# Patient Record
Sex: Female | Born: 1945 | Race: Black or African American | Hispanic: No | Smoking: Never smoker
Health system: Southern US, Community
[De-identification: ages and names within clinical notes are randomized; demographics above are authoritative.]

## PROBLEM LIST (undated history)

## (undated) DIAGNOSIS — M1990 Unspecified osteoarthritis, unspecified site: Secondary | ICD-10-CM

## (undated) DIAGNOSIS — E039 Hypothyroidism, unspecified: Secondary | ICD-10-CM

## (undated) DIAGNOSIS — I10 Essential (primary) hypertension: Secondary | ICD-10-CM

## (undated) HISTORY — PX: TOE AMPUTATION: SHX809

## (undated) HISTORY — PX: ABDOMINAL HYSTERECTOMY: SHX81

## (undated) HISTORY — PX: FRACTURE SURGERY: SHX138

## (undated) HISTORY — PX: CHOLECYSTECTOMY: SHX55

## (undated) HISTORY — PX: CLAVICLE SURGERY: SHX598

## (undated) HISTORY — PX: ANKLE FRACTURE SURGERY: SHX122

---

## 1998-04-26 ENCOUNTER — Ambulatory Visit (HOSPITAL_COMMUNITY): Admission: RE | Admit: 1998-04-26 | Discharge: 1998-04-26 | Payer: Self-pay | Admitting: General Surgery

## 1998-05-03 ENCOUNTER — Ambulatory Visit (HOSPITAL_COMMUNITY): Admission: RE | Admit: 1998-05-03 | Discharge: 1998-05-03 | Payer: Self-pay | Admitting: General Surgery

## 1998-05-11 ENCOUNTER — Encounter: Admission: RE | Admit: 1998-05-11 | Discharge: 1998-05-11 | Payer: Self-pay | Admitting: Infectious Diseases

## 1998-05-14 ENCOUNTER — Encounter (HOSPITAL_COMMUNITY): Admission: RE | Admit: 1998-05-14 | Discharge: 1998-08-12 | Payer: Self-pay | Admitting: Infectious Diseases

## 1998-05-24 ENCOUNTER — Encounter: Admission: RE | Admit: 1998-05-24 | Discharge: 1998-05-24 | Payer: Self-pay | Admitting: Infectious Diseases

## 1998-06-29 ENCOUNTER — Encounter: Admission: RE | Admit: 1998-06-29 | Discharge: 1998-06-29 | Payer: Self-pay | Admitting: Internal Medicine

## 2000-02-26 ENCOUNTER — Encounter: Payer: Self-pay | Admitting: Cardiovascular Disease

## 2000-02-26 ENCOUNTER — Ambulatory Visit (HOSPITAL_COMMUNITY): Admission: RE | Admit: 2000-02-26 | Discharge: 2000-02-26 | Payer: Self-pay | Admitting: Cardiovascular Disease

## 2000-06-03 ENCOUNTER — Encounter: Admission: RE | Admit: 2000-06-03 | Discharge: 2000-06-03 | Payer: Self-pay | Admitting: Cardiovascular Disease

## 2000-06-03 ENCOUNTER — Encounter: Payer: Self-pay | Admitting: Cardiovascular Disease

## 2000-06-07 ENCOUNTER — Encounter: Payer: Self-pay | Admitting: Emergency Medicine

## 2000-06-07 ENCOUNTER — Emergency Department (HOSPITAL_COMMUNITY): Admission: EM | Admit: 2000-06-07 | Discharge: 2000-06-07 | Payer: Self-pay | Admitting: Emergency Medicine

## 2000-06-09 ENCOUNTER — Inpatient Hospital Stay (HOSPITAL_COMMUNITY): Admission: AD | Admit: 2000-06-09 | Discharge: 2000-06-24 | Payer: Self-pay | Admitting: Cardiovascular Disease

## 2000-06-09 ENCOUNTER — Encounter: Payer: Self-pay | Admitting: Cardiovascular Disease

## 2000-06-13 ENCOUNTER — Encounter (HOSPITAL_BASED_OUTPATIENT_CLINIC_OR_DEPARTMENT_OTHER): Payer: Self-pay | Admitting: General Surgery

## 2000-06-16 ENCOUNTER — Encounter: Payer: Self-pay | Admitting: Cardiovascular Disease

## 2000-06-25 ENCOUNTER — Encounter (HOSPITAL_COMMUNITY): Admission: RE | Admit: 2000-06-25 | Discharge: 2000-09-23 | Payer: Self-pay | Admitting: Cardiovascular Disease

## 2000-07-08 ENCOUNTER — Encounter (HOSPITAL_BASED_OUTPATIENT_CLINIC_OR_DEPARTMENT_OTHER): Payer: Self-pay | Admitting: General Surgery

## 2000-07-08 ENCOUNTER — Ambulatory Visit (HOSPITAL_COMMUNITY): Admission: RE | Admit: 2000-07-08 | Discharge: 2000-07-08 | Payer: Self-pay | Admitting: General Surgery

## 2000-08-04 ENCOUNTER — Encounter: Admission: RE | Admit: 2000-08-04 | Discharge: 2000-11-02 | Payer: Self-pay | Admitting: Orthopedic Surgery

## 2000-08-06 ENCOUNTER — Ambulatory Visit (HOSPITAL_COMMUNITY): Admission: RE | Admit: 2000-08-06 | Discharge: 2000-08-06 | Payer: Self-pay | Admitting: General Surgery

## 2000-08-06 ENCOUNTER — Encounter (HOSPITAL_BASED_OUTPATIENT_CLINIC_OR_DEPARTMENT_OTHER): Payer: Self-pay | Admitting: General Surgery

## 2001-01-12 ENCOUNTER — Encounter: Admission: RE | Admit: 2001-01-12 | Discharge: 2001-04-12 | Payer: Self-pay | Admitting: Cardiovascular Disease

## 2001-05-31 ENCOUNTER — Emergency Department (HOSPITAL_COMMUNITY): Admission: EM | Admit: 2001-05-31 | Discharge: 2001-05-31 | Payer: Self-pay | Admitting: *Deleted

## 2001-06-01 ENCOUNTER — Encounter: Payer: Self-pay | Admitting: Cardiovascular Disease

## 2001-06-01 ENCOUNTER — Inpatient Hospital Stay (HOSPITAL_COMMUNITY): Admission: AD | Admit: 2001-06-01 | Discharge: 2001-06-05 | Payer: Self-pay | Admitting: Cardiovascular Disease

## 2001-06-15 ENCOUNTER — Encounter: Admission: RE | Admit: 2001-06-15 | Discharge: 2001-08-02 | Payer: Self-pay | Admitting: Orthopedic Surgery

## 2001-10-23 ENCOUNTER — Encounter: Admission: RE | Admit: 2001-10-23 | Discharge: 2002-01-21 | Payer: Self-pay | Admitting: Surgery

## 2002-02-26 ENCOUNTER — Emergency Department (HOSPITAL_COMMUNITY): Admission: EM | Admit: 2002-02-26 | Discharge: 2002-02-26 | Payer: Self-pay | Admitting: Emergency Medicine

## 2002-03-02 ENCOUNTER — Encounter: Admission: RE | Admit: 2002-03-02 | Discharge: 2002-03-25 | Payer: Self-pay | Admitting: *Deleted

## 2002-03-17 ENCOUNTER — Encounter: Payer: Self-pay | Admitting: *Deleted

## 2002-03-17 ENCOUNTER — Emergency Department (HOSPITAL_COMMUNITY): Admission: EM | Admit: 2002-03-17 | Discharge: 2002-03-17 | Payer: Self-pay | Admitting: *Deleted

## 2002-03-30 ENCOUNTER — Encounter (HOSPITAL_BASED_OUTPATIENT_CLINIC_OR_DEPARTMENT_OTHER): Admission: RE | Admit: 2002-03-30 | Discharge: 2002-06-03 | Payer: Self-pay | Admitting: Internal Medicine

## 2002-06-15 ENCOUNTER — Encounter (HOSPITAL_BASED_OUTPATIENT_CLINIC_OR_DEPARTMENT_OTHER): Admission: RE | Admit: 2002-06-15 | Discharge: 2002-08-12 | Payer: Self-pay | Admitting: Internal Medicine

## 2002-09-14 ENCOUNTER — Encounter (HOSPITAL_BASED_OUTPATIENT_CLINIC_OR_DEPARTMENT_OTHER): Admission: RE | Admit: 2002-09-14 | Discharge: 2002-12-13 | Payer: Self-pay | Admitting: Internal Medicine

## 2003-01-08 ENCOUNTER — Encounter: Payer: Self-pay | Admitting: Emergency Medicine

## 2003-01-08 ENCOUNTER — Emergency Department (HOSPITAL_COMMUNITY): Admission: EM | Admit: 2003-01-08 | Discharge: 2003-01-08 | Payer: Self-pay | Admitting: Emergency Medicine

## 2003-02-10 ENCOUNTER — Encounter (HOSPITAL_BASED_OUTPATIENT_CLINIC_OR_DEPARTMENT_OTHER): Admission: RE | Admit: 2003-02-10 | Discharge: 2003-05-11 | Payer: Self-pay | Admitting: Internal Medicine

## 2003-03-18 ENCOUNTER — Encounter: Payer: Self-pay | Admitting: *Deleted

## 2003-03-18 ENCOUNTER — Emergency Department (HOSPITAL_COMMUNITY): Admission: EM | Admit: 2003-03-18 | Discharge: 2003-03-18 | Payer: Self-pay | Admitting: *Deleted

## 2003-06-07 ENCOUNTER — Encounter (HOSPITAL_BASED_OUTPATIENT_CLINIC_OR_DEPARTMENT_OTHER): Admission: RE | Admit: 2003-06-07 | Discharge: 2003-09-05 | Payer: Self-pay | Admitting: Internal Medicine

## 2003-07-17 ENCOUNTER — Emergency Department (HOSPITAL_COMMUNITY): Admission: EM | Admit: 2003-07-17 | Discharge: 2003-07-17 | Payer: Self-pay | Admitting: Emergency Medicine

## 2003-09-08 ENCOUNTER — Encounter (HOSPITAL_BASED_OUTPATIENT_CLINIC_OR_DEPARTMENT_OTHER): Admission: RE | Admit: 2003-09-08 | Discharge: 2003-09-20 | Payer: Self-pay | Admitting: Internal Medicine

## 2003-11-22 ENCOUNTER — Encounter (HOSPITAL_BASED_OUTPATIENT_CLINIC_OR_DEPARTMENT_OTHER): Admission: RE | Admit: 2003-11-22 | Discharge: 2004-01-23 | Payer: Self-pay | Admitting: Internal Medicine

## 2004-01-03 ENCOUNTER — Emergency Department (HOSPITAL_COMMUNITY): Admission: EM | Admit: 2004-01-03 | Discharge: 2004-01-03 | Payer: Self-pay | Admitting: Emergency Medicine

## 2004-04-02 ENCOUNTER — Inpatient Hospital Stay (HOSPITAL_COMMUNITY): Admission: EM | Admit: 2004-04-02 | Discharge: 2004-04-05 | Payer: Self-pay | Admitting: Emergency Medicine

## 2004-04-10 ENCOUNTER — Encounter (HOSPITAL_BASED_OUTPATIENT_CLINIC_OR_DEPARTMENT_OTHER): Admission: RE | Admit: 2004-04-10 | Discharge: 2004-04-30 | Payer: Self-pay | Admitting: Internal Medicine

## 2004-05-09 ENCOUNTER — Emergency Department (HOSPITAL_COMMUNITY): Admission: EM | Admit: 2004-05-09 | Discharge: 2004-05-09 | Payer: Self-pay | Admitting: Emergency Medicine

## 2004-07-24 ENCOUNTER — Encounter (HOSPITAL_BASED_OUTPATIENT_CLINIC_OR_DEPARTMENT_OTHER): Admission: RE | Admit: 2004-07-24 | Discharge: 2004-08-09 | Payer: Self-pay | Admitting: Internal Medicine

## 2004-10-12 ENCOUNTER — Emergency Department (HOSPITAL_COMMUNITY): Admission: EM | Admit: 2004-10-12 | Discharge: 2004-10-12 | Payer: Self-pay | Admitting: Emergency Medicine

## 2005-01-19 ENCOUNTER — Emergency Department (HOSPITAL_COMMUNITY): Admission: EM | Admit: 2005-01-19 | Discharge: 2005-01-19 | Payer: Self-pay | Admitting: *Deleted

## 2005-02-20 ENCOUNTER — Emergency Department (HOSPITAL_COMMUNITY): Admission: EM | Admit: 2005-02-20 | Discharge: 2005-02-20 | Payer: Self-pay | Admitting: Emergency Medicine

## 2005-03-04 ENCOUNTER — Inpatient Hospital Stay (HOSPITAL_COMMUNITY): Admission: EM | Admit: 2005-03-04 | Discharge: 2005-03-10 | Payer: Self-pay | Admitting: Emergency Medicine

## 2005-06-06 ENCOUNTER — Inpatient Hospital Stay (HOSPITAL_COMMUNITY): Admission: EM | Admit: 2005-06-06 | Discharge: 2005-06-09 | Payer: Self-pay | Admitting: Emergency Medicine

## 2005-08-01 ENCOUNTER — Ambulatory Visit (HOSPITAL_COMMUNITY): Admission: RE | Admit: 2005-08-01 | Discharge: 2005-08-01 | Payer: Self-pay | Admitting: Cardiovascular Disease

## 2005-08-28 ENCOUNTER — Inpatient Hospital Stay (HOSPITAL_COMMUNITY): Admission: EM | Admit: 2005-08-28 | Discharge: 2005-09-01 | Payer: Self-pay | Admitting: Emergency Medicine

## 2005-08-29 ENCOUNTER — Encounter (INDEPENDENT_AMBULATORY_CARE_PROVIDER_SITE_OTHER): Payer: Self-pay | Admitting: Cardiovascular Disease

## 2005-08-30 ENCOUNTER — Ambulatory Visit: Payer: Self-pay | Admitting: Internal Medicine

## 2005-10-28 ENCOUNTER — Ambulatory Visit: Payer: Self-pay | Admitting: Gastroenterology

## 2006-01-16 ENCOUNTER — Encounter: Admission: RE | Admit: 2006-01-16 | Discharge: 2006-01-16 | Payer: Self-pay | Admitting: General Surgery

## 2006-01-29 ENCOUNTER — Encounter: Admission: RE | Admit: 2006-01-29 | Discharge: 2006-01-29 | Payer: Self-pay | Admitting: General Surgery

## 2006-02-07 ENCOUNTER — Encounter (INDEPENDENT_AMBULATORY_CARE_PROVIDER_SITE_OTHER): Payer: Self-pay | Admitting: Specialist

## 2006-02-07 ENCOUNTER — Ambulatory Visit (HOSPITAL_COMMUNITY): Admission: RE | Admit: 2006-02-07 | Discharge: 2006-02-07 | Payer: Self-pay | Admitting: General Surgery

## 2006-02-13 ENCOUNTER — Inpatient Hospital Stay (HOSPITAL_COMMUNITY): Admission: AD | Admit: 2006-02-13 | Discharge: 2006-02-25 | Payer: Self-pay | Admitting: Cardiovascular Disease

## 2007-05-16 ENCOUNTER — Emergency Department (HOSPITAL_COMMUNITY): Admission: EM | Admit: 2007-05-16 | Discharge: 2007-05-16 | Payer: Self-pay | Admitting: Emergency Medicine

## 2007-05-19 ENCOUNTER — Emergency Department (HOSPITAL_COMMUNITY): Admission: EM | Admit: 2007-05-19 | Discharge: 2007-05-19 | Payer: Self-pay | Admitting: Emergency Medicine

## 2007-05-24 ENCOUNTER — Inpatient Hospital Stay (HOSPITAL_COMMUNITY): Admission: AD | Admit: 2007-05-24 | Discharge: 2007-05-28 | Payer: Self-pay | Admitting: Cardiovascular Disease

## 2007-06-30 ENCOUNTER — Encounter (HOSPITAL_BASED_OUTPATIENT_CLINIC_OR_DEPARTMENT_OTHER): Admission: RE | Admit: 2007-06-30 | Discharge: 2007-09-21 | Payer: Self-pay | Admitting: Surgery

## 2007-09-20 ENCOUNTER — Encounter (HOSPITAL_BASED_OUTPATIENT_CLINIC_OR_DEPARTMENT_OTHER): Admission: RE | Admit: 2007-09-20 | Discharge: 2007-10-07 | Payer: Self-pay | Admitting: Surgery

## 2008-01-03 ENCOUNTER — Encounter (HOSPITAL_BASED_OUTPATIENT_CLINIC_OR_DEPARTMENT_OTHER): Admission: RE | Admit: 2008-01-03 | Discharge: 2008-04-02 | Payer: Self-pay | Admitting: Surgery

## 2008-02-12 ENCOUNTER — Inpatient Hospital Stay (HOSPITAL_COMMUNITY): Admission: AD | Admit: 2008-02-12 | Discharge: 2008-02-17 | Payer: Self-pay | Admitting: Cardiovascular Disease

## 2008-03-08 ENCOUNTER — Inpatient Hospital Stay (HOSPITAL_COMMUNITY): Admission: EM | Admit: 2008-03-08 | Discharge: 2008-03-14 | Payer: Self-pay | Admitting: Emergency Medicine

## 2008-03-23 ENCOUNTER — Inpatient Hospital Stay (HOSPITAL_COMMUNITY): Admission: EM | Admit: 2008-03-23 | Discharge: 2008-04-05 | Payer: Self-pay | Admitting: Emergency Medicine

## 2008-04-06 ENCOUNTER — Ambulatory Visit: Payer: Self-pay | Admitting: Internal Medicine

## 2008-05-22 ENCOUNTER — Ambulatory Visit: Payer: Self-pay | Admitting: Gastroenterology

## 2008-09-03 ENCOUNTER — Emergency Department (HOSPITAL_COMMUNITY): Admission: EM | Admit: 2008-09-03 | Discharge: 2008-09-04 | Payer: Self-pay | Admitting: Emergency Medicine

## 2008-12-16 IMAGING — CR DG CHEST 2V
2 series · 2 of 2 positions shown · non-contrast
Comparison: 03/08/2008

CLINICAL DATA: Short of breath and weakness

CHEST - 2 VIEW

[w chest lat]
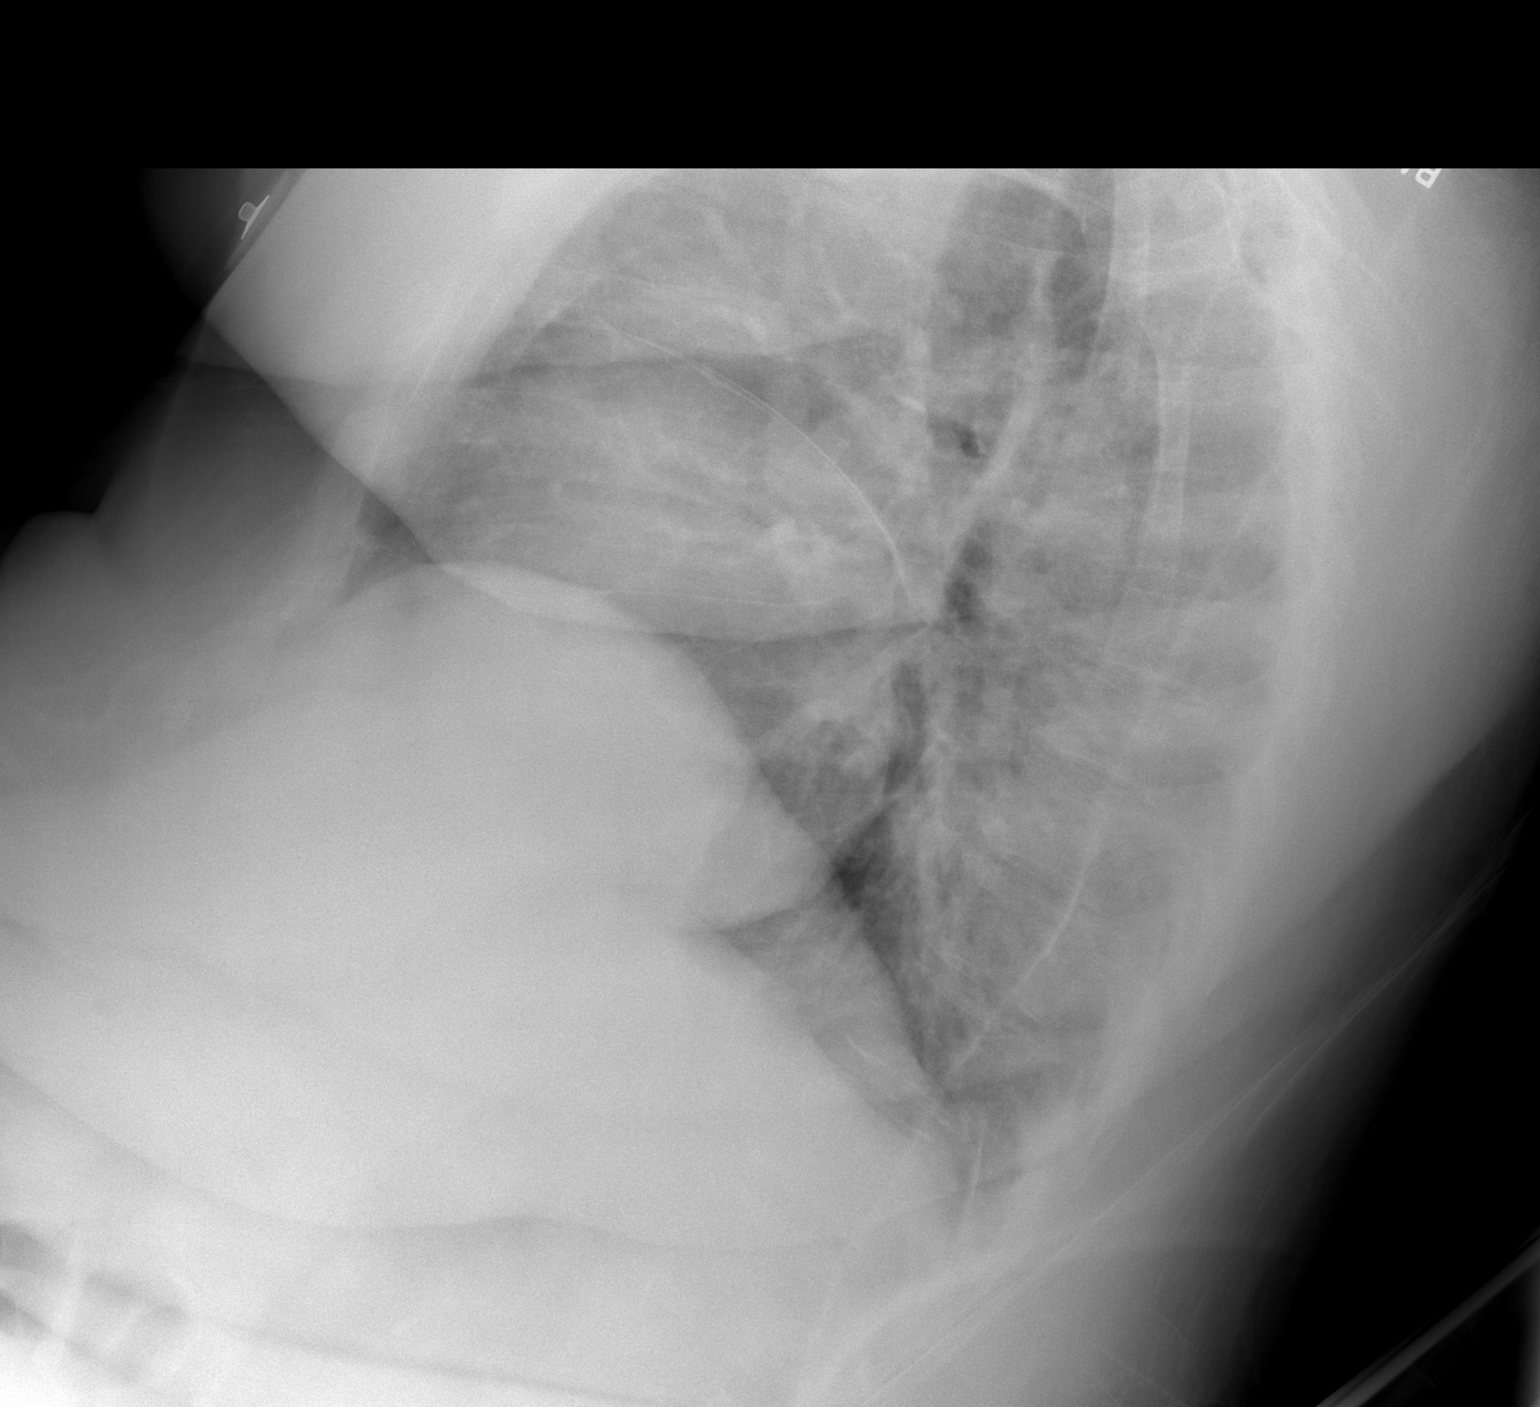

[view not recorded]
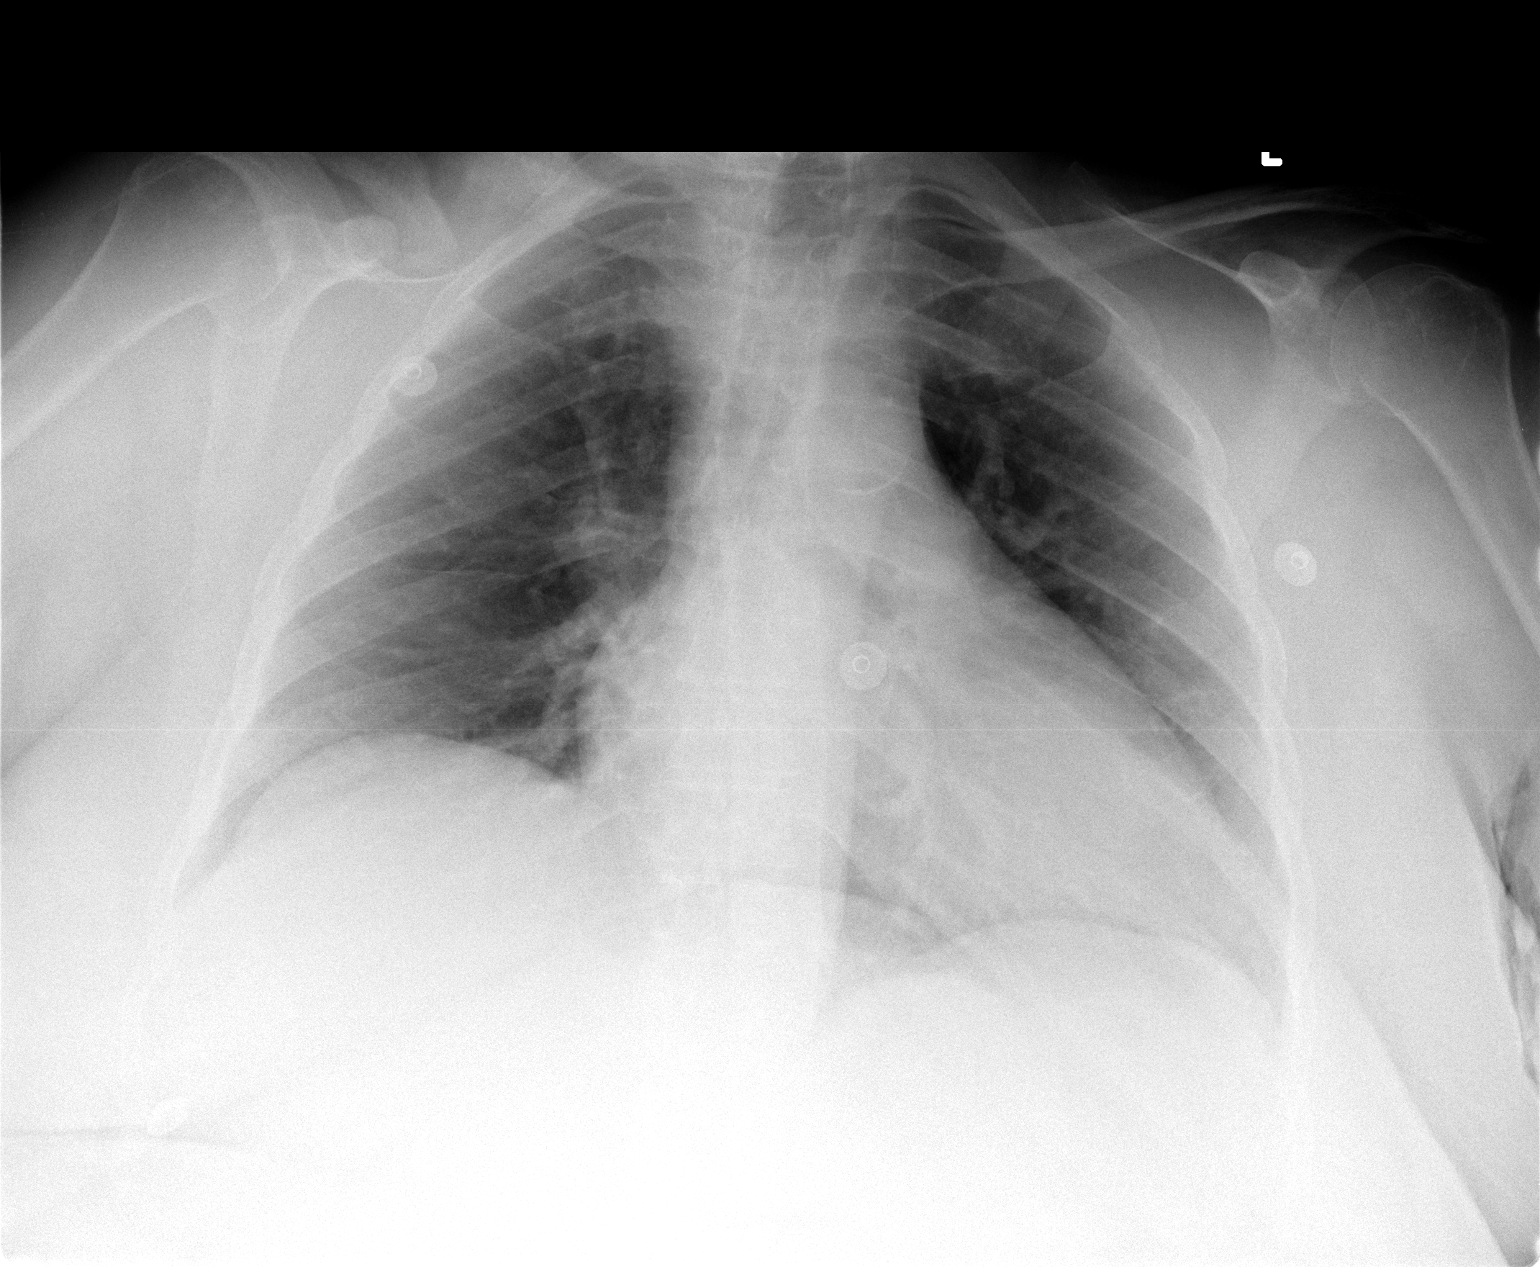

[2 of 2 positions shown; findings below may reference images not displayed]

FINDINGS: The heart is enlarged but there is no heart failure or
edema.  There is no infiltrate or effusion
IMPRESSION: No active cardiopulmonary disease.

## 2008-12-20 IMAGING — CR DG ABDOMEN 2V
3 series · 3 of 3 positions shown · non-contrast
Comparison: 03/05/2005

CLINICAL DATA: Abdominal pain/GI bleed

ABDOMEN - 2 VIEW

[w abdomen upright]
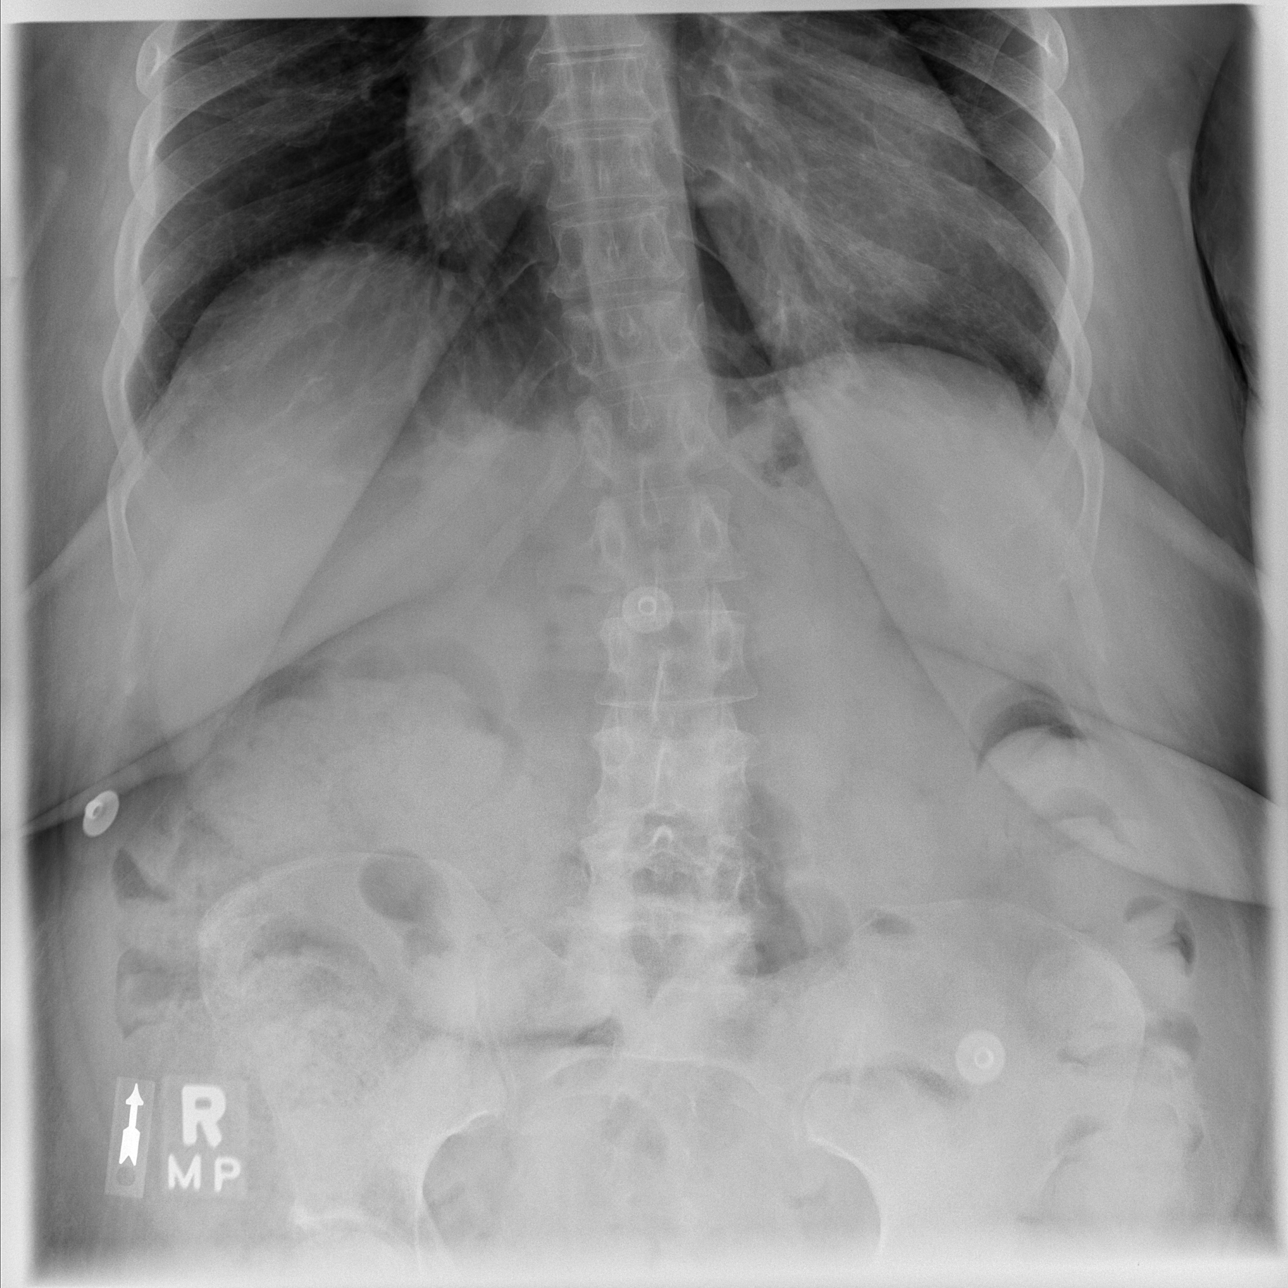

[t abdomen supine (1 of 2)]
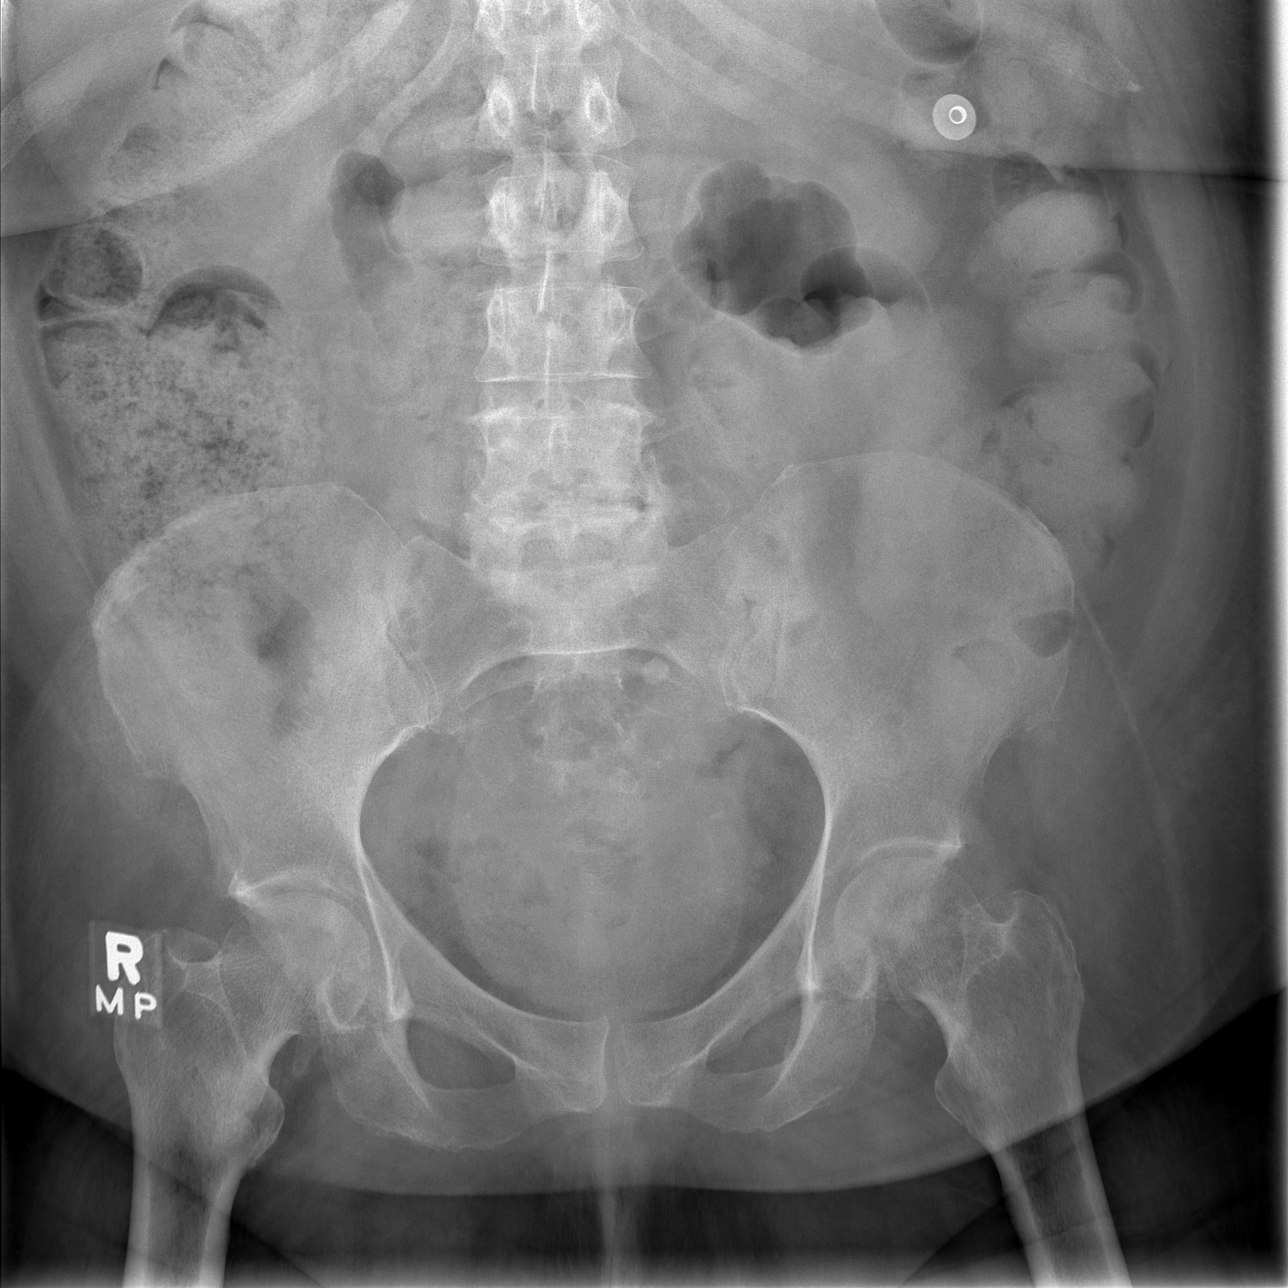

[t abdomen supine (2 of 2)]
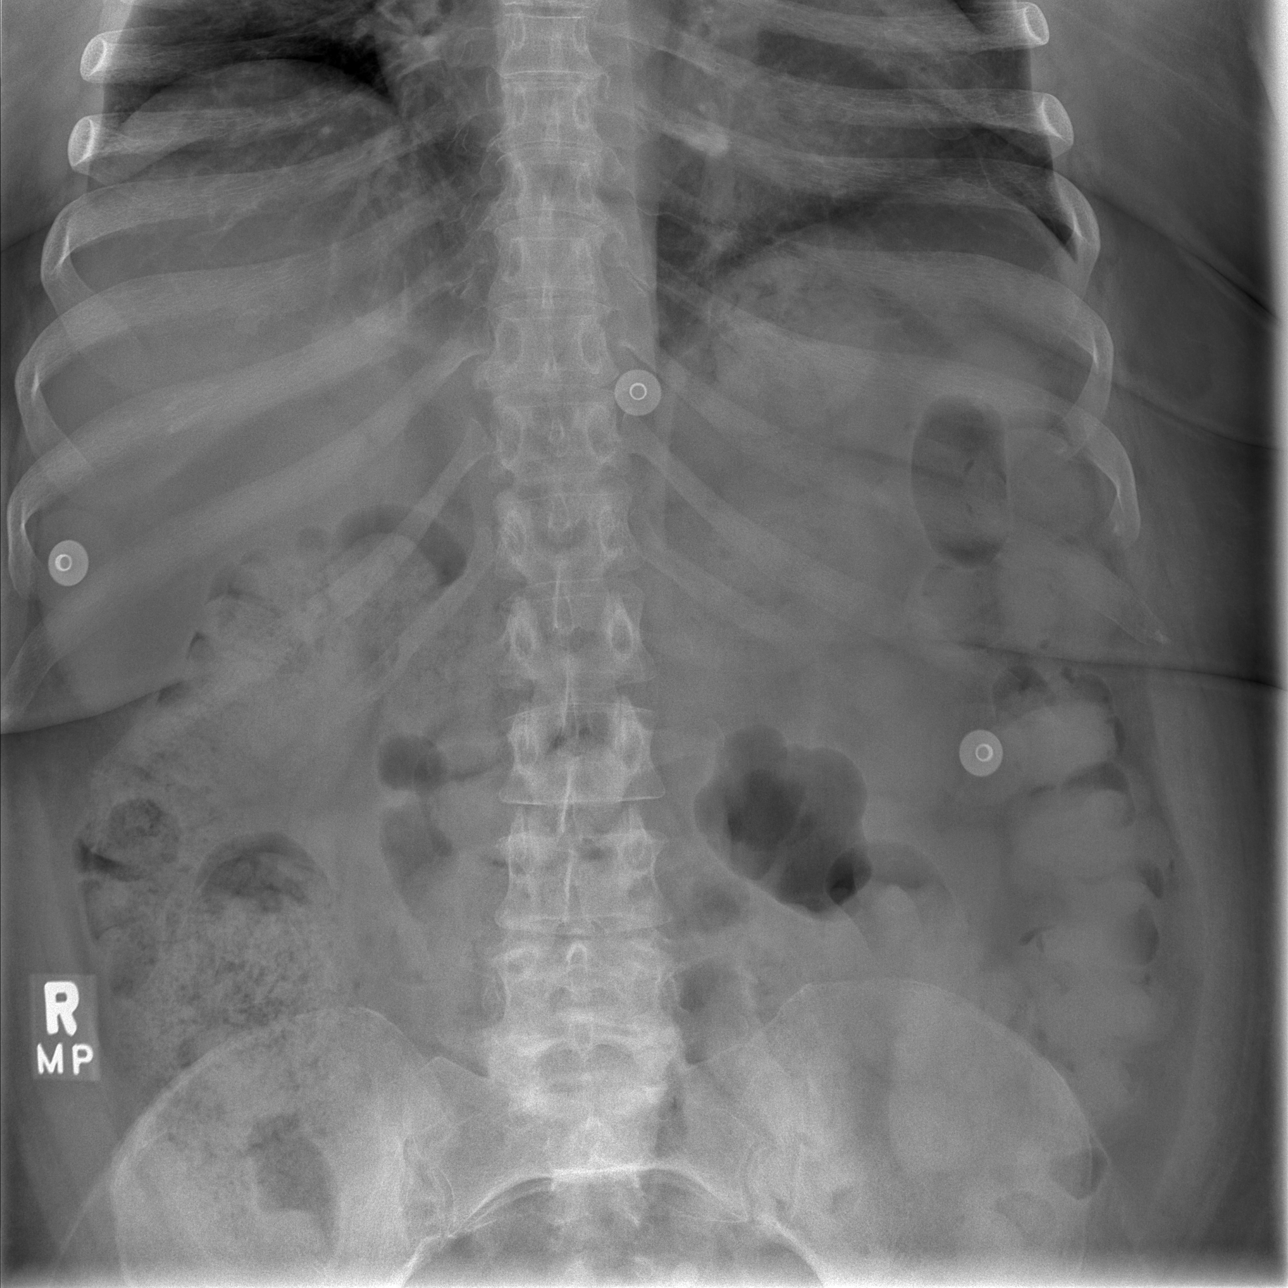

[3 of 3 positions shown; findings below may reference images not displayed]

FINDINGS: No free air or acute/specific abnormality of the bowel
gas pattern.  No pathological calcifications or ascites.  Osseous
structures intact with degenerative changes of the lumbosacral
spine.
IMPRESSION: No acute or specific radiographic abnormality.

## 2008-12-31 ENCOUNTER — Inpatient Hospital Stay (HOSPITAL_COMMUNITY): Admission: EM | Admit: 2008-12-31 | Discharge: 2009-01-02 | Payer: Self-pay | Admitting: Emergency Medicine

## 2009-01-01 ENCOUNTER — Encounter (INDEPENDENT_AMBULATORY_CARE_PROVIDER_SITE_OTHER): Payer: Self-pay | Admitting: Cardiovascular Disease

## 2009-01-02 ENCOUNTER — Encounter (HOSPITAL_COMMUNITY): Admission: RE | Admit: 2009-01-02 | Discharge: 2009-01-02 | Payer: Self-pay | Admitting: Cardiovascular Disease

## 2009-05-19 ENCOUNTER — Emergency Department (HOSPITAL_COMMUNITY): Admission: EM | Admit: 2009-05-19 | Discharge: 2009-05-19 | Payer: Self-pay | Admitting: Emergency Medicine

## 2009-12-02 ENCOUNTER — Emergency Department (HOSPITAL_COMMUNITY): Admission: EM | Admit: 2009-12-02 | Discharge: 2009-12-02 | Payer: Self-pay | Admitting: Emergency Medicine

## 2010-03-01 ENCOUNTER — Encounter: Admission: RE | Admit: 2010-03-01 | Discharge: 2010-03-01 | Payer: Self-pay | Admitting: Cardiovascular Disease

## 2010-04-08 ENCOUNTER — Inpatient Hospital Stay (HOSPITAL_COMMUNITY)
Admission: AD | Admit: 2010-04-08 | Discharge: 2010-04-12 | Payer: Self-pay | Source: Home / Self Care | Admitting: Cardiovascular Disease

## 2010-04-08 ENCOUNTER — Encounter (INDEPENDENT_AMBULATORY_CARE_PROVIDER_SITE_OTHER): Payer: Self-pay | Admitting: Cardiovascular Disease

## 2010-05-27 ENCOUNTER — Encounter (HOSPITAL_BASED_OUTPATIENT_CLINIC_OR_DEPARTMENT_OTHER): Admission: RE | Admit: 2010-05-27 | Discharge: 2010-06-21 | Payer: Self-pay | Admitting: General Surgery

## 2010-07-02 ENCOUNTER — Encounter (HOSPITAL_BASED_OUTPATIENT_CLINIC_OR_DEPARTMENT_OTHER): Admission: RE | Admit: 2010-07-02 | Discharge: 2010-08-22 | Payer: Self-pay | Admitting: General Surgery

## 2010-08-22 ENCOUNTER — Inpatient Hospital Stay (HOSPITAL_COMMUNITY): Admission: EM | Admit: 2010-08-22 | Discharge: 2010-08-31 | Payer: Self-pay | Admitting: Emergency Medicine

## 2011-03-06 LAB — GLUCOSE, CAPILLARY
Glucose-Capillary: 101 mg/dL — ABNORMAL HIGH (ref 70–99)
Glucose-Capillary: 101 mg/dL — ABNORMAL HIGH (ref 70–99)
Glucose-Capillary: 114 mg/dL — ABNORMAL HIGH (ref 70–99)
Glucose-Capillary: 124 mg/dL — ABNORMAL HIGH (ref 70–99)
Glucose-Capillary: 127 mg/dL — ABNORMAL HIGH (ref 70–99)
Glucose-Capillary: 131 mg/dL — ABNORMAL HIGH (ref 70–99)
Glucose-Capillary: 133 mg/dL — ABNORMAL HIGH (ref 70–99)
Glucose-Capillary: 136 mg/dL — ABNORMAL HIGH (ref 70–99)
Glucose-Capillary: 143 mg/dL — ABNORMAL HIGH (ref 70–99)
Glucose-Capillary: 157 mg/dL — ABNORMAL HIGH (ref 70–99)
Glucose-Capillary: 163 mg/dL — ABNORMAL HIGH (ref 70–99)
Glucose-Capillary: 171 mg/dL — ABNORMAL HIGH (ref 70–99)
Glucose-Capillary: 181 mg/dL — ABNORMAL HIGH (ref 70–99)
Glucose-Capillary: 182 mg/dL — ABNORMAL HIGH (ref 70–99)
Glucose-Capillary: 188 mg/dL — ABNORMAL HIGH (ref 70–99)
Glucose-Capillary: 193 mg/dL — ABNORMAL HIGH (ref 70–99)
Glucose-Capillary: 209 mg/dL — ABNORMAL HIGH (ref 70–99)
Glucose-Capillary: 273 mg/dL — ABNORMAL HIGH (ref 70–99)
Glucose-Capillary: 304 mg/dL — ABNORMAL HIGH (ref 70–99)
Glucose-Capillary: 384 mg/dL — ABNORMAL HIGH (ref 70–99)
Glucose-Capillary: 48 mg/dL — ABNORMAL LOW (ref 70–99)
Glucose-Capillary: 59 mg/dL — ABNORMAL LOW (ref 70–99)
Glucose-Capillary: 69 mg/dL — ABNORMAL LOW (ref 70–99)
Glucose-Capillary: 71 mg/dL (ref 70–99)
Glucose-Capillary: 76 mg/dL (ref 70–99)
Glucose-Capillary: 92 mg/dL (ref 70–99)
Glucose-Capillary: 96 mg/dL (ref 70–99)

## 2011-03-06 LAB — BASIC METABOLIC PANEL
BUN: 30 mg/dL — ABNORMAL HIGH (ref 6–23)
BUN: 32 mg/dL — ABNORMAL HIGH (ref 6–23)
BUN: 54 mg/dL — ABNORMAL HIGH (ref 6–23)
BUN: 73 mg/dL — ABNORMAL HIGH (ref 6–23)
CO2: 26 mEq/L (ref 19–32)
CO2: 27 mEq/L (ref 19–32)
CO2: 32 mEq/L (ref 19–32)
Calcium: 8.3 mg/dL — ABNORMAL LOW (ref 8.4–10.5)
Chloride: 100 mEq/L (ref 96–112)
Chloride: 106 mEq/L (ref 96–112)
Chloride: 106 mEq/L (ref 96–112)
Creatinine, Ser: 2.39 mg/dL — ABNORMAL HIGH (ref 0.4–1.2)
Creatinine, Ser: 2.39 mg/dL — ABNORMAL HIGH (ref 0.4–1.2)
Creatinine, Ser: 2.83 mg/dL — ABNORMAL HIGH (ref 0.4–1.2)
GFR calc Af Amer: 16 mL/min — ABNORMAL LOW (ref >60)
GFR calc Af Amer: 20 mL/min — ABNORMAL LOW (ref >60)
GFR calc Af Amer: 25 mL/min — ABNORMAL LOW (ref >60)
GFR calc non Af Amer: 13 mL/min — ABNORMAL LOW (ref >60)
GFR calc non Af Amer: 20 mL/min — ABNORMAL LOW (ref >60)
Potassium: 4.3 mEq/L (ref 3.5–5.1)
Potassium: 4.6 mEq/L (ref 3.5–5.1)
Potassium: 5.1 mEq/L (ref 3.5–5.1)
Sodium: 137 mEq/L (ref 135–145)
Sodium: 139 mEq/L (ref 135–145)

## 2011-03-06 LAB — CBC
HCT: 22.8 % — ABNORMAL LOW (ref 36.0–46.0)
Hemoglobin: 7.5 g/dL — ABNORMAL LOW (ref 12.0–15.0)
MCH: 28.9 pg (ref 26.0–34.0)
MCH: 29.1 pg (ref 26.0–34.0)
MCHC: 33.9 g/dL (ref 30.0–36.0)
MCV: 86.1 fL (ref 78.0–100.0)
MCV: 86.5 fL (ref 78.0–100.0)
Platelets: 284 10*3/uL (ref 150–400)
Platelets: 444 10*3/uL — ABNORMAL HIGH (ref 150–400)
RBC: 2.63 MIL/uL — ABNORMAL LOW (ref 3.87–5.11)
RDW: 14.7 % (ref 11.5–15.5)
RDW: 15.1 % (ref 11.5–15.5)
WBC: 6.4 10*3/uL (ref 4.0–10.5)
WBC: 9.9 10*3/uL (ref 4.0–10.5)

## 2011-03-06 LAB — URINE CULTURE
Colony Count: 2000
Culture  Setup Time: 201109010348

## 2011-03-06 LAB — CROSSMATCH: ABO/RH(D): B NEG

## 2011-03-06 LAB — DIFFERENTIAL
Basophils Absolute: 0 10*3/uL (ref 0.0–0.1)
Eosinophils Relative: 5 % (ref 0–5)
Lymphocytes Relative: 18 % (ref 12–46)
Neutro Abs: 4.4 10*3/uL (ref 1.7–7.7)

## 2011-03-06 LAB — PROTIME-INR: Prothrombin Time: 14.5 seconds (ref 11.6–15.2)

## 2011-03-06 LAB — URINALYSIS, ROUTINE W REFLEX MICROSCOPIC
Bilirubin Urine: NEGATIVE
Hgb urine dipstick: NEGATIVE
Protein, ur: 100 mg/dL — AB
Urobilinogen, UA: 0.2 mg/dL (ref 0.0–1.0)

## 2011-03-06 LAB — APTT: aPTT: 38 seconds — ABNORMAL HIGH (ref 24–37)

## 2011-03-06 LAB — SAMPLE TO BLOOD BANK

## 2011-03-06 LAB — URINE MICROSCOPIC-ADD ON

## 2011-03-11 LAB — CLOSTRIDIUM DIFFICILE EIA

## 2011-03-11 LAB — BASIC METABOLIC PANEL
BUN: 40 mg/dL — ABNORMAL HIGH (ref 6–23)
BUN: 42 mg/dL — ABNORMAL HIGH (ref 6–23)
CO2: 26 mEq/L (ref 19–32)
CO2: 27 mEq/L (ref 19–32)
Calcium: 8.7 mg/dL (ref 8.4–10.5)
Calcium: 8.9 mg/dL (ref 8.4–10.5)
Chloride: 102 mEq/L (ref 96–112)
Chloride: 108 mEq/L (ref 96–112)
Creatinine, Ser: 2.53 mg/dL — ABNORMAL HIGH (ref 0.4–1.2)
Creatinine, Ser: 2.56 mg/dL — ABNORMAL HIGH (ref 0.4–1.2)
Creatinine, Ser: 2.67 mg/dL — ABNORMAL HIGH (ref 0.4–1.2)
GFR calc Af Amer: 22 mL/min — ABNORMAL LOW (ref >60)
GFR calc non Af Amer: 18 mL/min — ABNORMAL LOW (ref >60)
GFR calc non Af Amer: 19 mL/min — ABNORMAL LOW (ref >60)
GFR calc non Af Amer: 19 mL/min — ABNORMAL LOW (ref >60)
Glucose, Bld: 110 mg/dL — ABNORMAL HIGH (ref 70–99)
Glucose, Bld: 80 mg/dL (ref 70–99)
Potassium: 4.5 mEq/L (ref 3.5–5.1)
Potassium: 5.2 mEq/L — ABNORMAL HIGH (ref 3.5–5.1)
Sodium: 136 mEq/L (ref 135–145)
Sodium: 142 mEq/L (ref 135–145)
Sodium: 143 mEq/L (ref 135–145)

## 2011-03-11 LAB — CBC
Hemoglobin: 8.3 g/dL — ABNORMAL LOW (ref 12.0–15.0)
MCV: 89.7 fL (ref 78.0–100.0)
RBC: 2.8 MIL/uL — ABNORMAL LOW (ref 3.87–5.11)
RBC: 2.89 MIL/uL — ABNORMAL LOW (ref 3.87–5.11)
WBC: 10.2 10*3/uL (ref 4.0–10.5)

## 2011-03-11 LAB — CARDIAC PANEL(CRET KIN+CKTOT+MB+TROPI)
CK, MB: 2.9 ng/mL (ref 0.3–4.0)
Relative Index: 2 (ref 0.0–2.5)
Relative Index: 2.1 (ref 0.0–2.5)
Total CK: 165 U/L (ref 7–177)
Troponin I: 0.02 ng/mL (ref 0.00–0.06)

## 2011-03-11 LAB — COMPREHENSIVE METABOLIC PANEL
ALT: 13 U/L (ref 0–35)
Alkaline Phosphatase: 169 U/L — ABNORMAL HIGH (ref 39–117)
CO2: 24 mEq/L (ref 19–32)
Chloride: 110 mEq/L (ref 96–112)
GFR calc Af Amer: 24 mL/min — ABNORMAL LOW (ref >60)
GFR calc non Af Amer: 20 mL/min — ABNORMAL LOW (ref >60)
Glucose, Bld: 198 mg/dL — ABNORMAL HIGH (ref 70–99)
Potassium: 4.7 mEq/L (ref 3.5–5.1)
Sodium: 139 mEq/L (ref 135–145)
Total Bilirubin: 0.1 mg/dL — ABNORMAL LOW (ref 0.3–1.2)

## 2011-03-11 LAB — DIFFERENTIAL
Basophils Relative: 0 % (ref 0–1)
Eosinophils Absolute: 0.1 10*3/uL (ref 0.0–0.7)
Monocytes Relative: 6 % (ref 3–12)
Neutrophils Relative %: 74 % (ref 43–77)

## 2011-03-11 LAB — GLUCOSE, CAPILLARY
Glucose-Capillary: 188 mg/dL — ABNORMAL HIGH (ref 70–99)
Glucose-Capillary: 202 mg/dL — ABNORMAL HIGH (ref 70–99)
Glucose-Capillary: 207 mg/dL — ABNORMAL HIGH (ref 70–99)
Glucose-Capillary: 76 mg/dL (ref 70–99)
Glucose-Capillary: 91 mg/dL (ref 70–99)
Glucose-Capillary: 91 mg/dL (ref 70–99)
Glucose-Capillary: 98 mg/dL (ref 70–99)

## 2011-03-11 LAB — HEMOGLOBIN A1C
Hgb A1c MFr Bld: 7.7 % — ABNORMAL HIGH (ref <5.7)
Mean Plasma Glucose: 174 mg/dL — ABNORMAL HIGH (ref <117)

## 2011-03-11 LAB — IRON AND TIBC
Saturation Ratios: 10 % — ABNORMAL LOW (ref 20–55)
TIBC: 249 ug/dL — ABNORMAL LOW (ref 250–470)

## 2011-03-11 LAB — CULTURE, BLOOD (ROUTINE X 2): Culture: NO GROWTH

## 2011-03-11 LAB — BRAIN NATRIURETIC PEPTIDE: Pro B Natriuretic peptide (BNP): 117 pg/mL — ABNORMAL HIGH (ref 0.0–100.0)

## 2011-03-11 LAB — HEMOCCULT GUIAC POC 1CARD (OFFICE): Fecal Occult Bld: POSITIVE

## 2011-03-11 LAB — FERRITIN: Ferritin: 38 ng/mL (ref 10–291)

## 2011-04-01 LAB — DIFFERENTIAL
Basophils Absolute: 0 10*3/uL (ref 0.0–0.1)
Basophils Relative: 0 % (ref 0–1)
Lymphocytes Relative: 24 % (ref 12–46)
Monocytes Relative: 5 % (ref 3–12)
Neutro Abs: 7 10*3/uL (ref 1.7–7.7)
Neutrophils Relative %: 68 % (ref 43–77)

## 2011-04-01 LAB — COMPREHENSIVE METABOLIC PANEL
Alkaline Phosphatase: 192 U/L — ABNORMAL HIGH (ref 39–117)
BUN: 44 mg/dL — ABNORMAL HIGH (ref 6–23)
Creatinine, Ser: 2.09 mg/dL — ABNORMAL HIGH (ref 0.4–1.2)
Glucose, Bld: 279 mg/dL — ABNORMAL HIGH (ref 70–99)
Potassium: 4.2 mEq/L (ref 3.5–5.1)
Total Protein: 7.6 g/dL (ref 6.0–8.3)

## 2011-04-01 LAB — CBC
HCT: 31.7 % — ABNORMAL LOW (ref 36.0–46.0)
Hemoglobin: 10.4 g/dL — ABNORMAL LOW (ref 12.0–15.0)
MCHC: 32.9 g/dL (ref 30.0–36.0)
MCV: 90.4 fL (ref 78.0–100.0)
RDW: 14.1 % (ref 11.5–15.5)

## 2011-04-07 LAB — CBC
HCT: 28.3 % — ABNORMAL LOW (ref 36.0–46.0)
HCT: 30.5 % — ABNORMAL LOW (ref 36.0–46.0)
HCT: 33.5 % — ABNORMAL LOW (ref 36.0–46.0)
Hemoglobin: 11.2 g/dL — ABNORMAL LOW (ref 12.0–15.0)
Hemoglobin: 9.4 g/dL — ABNORMAL LOW (ref 12.0–15.0)
MCHC: 33.1 g/dL (ref 30.0–36.0)
MCHC: 33.4 g/dL (ref 30.0–36.0)
MCV: 91.4 fL (ref 78.0–100.0)
MCV: 91.4 fL (ref 78.0–100.0)
Platelets: 349 10*3/uL (ref 150–400)
Platelets: 409 10*3/uL — ABNORMAL HIGH (ref 150–400)
RBC: 3.1 MIL/uL — ABNORMAL LOW (ref 3.87–5.11)
RBC: 3.67 MIL/uL — ABNORMAL LOW (ref 3.87–5.11)
RDW: 14.2 % (ref 11.5–15.5)
WBC: 10.1 10*3/uL (ref 4.0–10.5)
WBC: 10.4 10*3/uL (ref 4.0–10.5)
WBC: 13.5 10*3/uL — ABNORMAL HIGH (ref 4.0–10.5)

## 2011-04-07 LAB — D-DIMER, QUANTITATIVE: D-Dimer, Quant: 0.34 ug/mL-FEU (ref 0.00–0.48)

## 2011-04-07 LAB — POCT CARDIAC MARKERS
Myoglobin, poc: 174 ng/mL (ref 12–200)
Troponin i, poc: <0.05 ng/mL (ref 0.00–0.09)

## 2011-04-07 LAB — CK TOTAL AND CKMB (NOT AT ARMC): Relative Index: INVALID (ref 0.0–2.5)

## 2011-04-07 LAB — GLUCOSE, CAPILLARY
Glucose-Capillary: 113 mg/dL — ABNORMAL HIGH (ref 70–99)
Glucose-Capillary: 115 mg/dL — ABNORMAL HIGH (ref 70–99)
Glucose-Capillary: 129 mg/dL — ABNORMAL HIGH (ref 70–99)
Glucose-Capillary: 208 mg/dL — ABNORMAL HIGH (ref 70–99)
Glucose-Capillary: 215 mg/dL — ABNORMAL HIGH (ref 70–99)
Glucose-Capillary: 254 mg/dL — ABNORMAL HIGH (ref 70–99)
Glucose-Capillary: 66 mg/dL — ABNORMAL LOW (ref 70–99)
Glucose-Capillary: 82 mg/dL (ref 70–99)

## 2011-04-07 LAB — LIPID PANEL
Cholesterol: 125 mg/dL (ref 0–200)
Total CHOL/HDL Ratio: 3 RATIO

## 2011-04-07 LAB — DIFFERENTIAL
Lymphocytes Relative: 19 % (ref 12–46)
Lymphs Abs: 2 10*3/uL (ref 0.7–4.0)
Neutrophils Relative %: 72 % (ref 43–77)

## 2011-04-07 LAB — HEPARIN LEVEL (UNFRACTIONATED): Heparin Unfractionated: 0.32 IU/mL (ref 0.30–0.70)

## 2011-04-07 LAB — POCT I-STAT, CHEM 8
BUN: 42 mg/dL — ABNORMAL HIGH (ref 6–23)
Chloride: 106 mEq/L (ref 96–112)
Sodium: 143 mEq/L (ref 135–145)

## 2011-04-07 LAB — TROPONIN I: Troponin I: 0.01 ng/mL (ref 0.00–0.06)

## 2011-04-07 LAB — BRAIN NATRIURETIC PEPTIDE: Pro B Natriuretic peptide (BNP): 74.3 pg/mL (ref 0.0–100.0)

## 2011-04-07 LAB — APTT: aPTT: 34 seconds (ref 24–37)

## 2011-05-06 NOTE — Consult Note (Signed)
[name], [name]            ACCOUNT NO.:  [id number]   MEDICAL RECORD NO.:  [id number]          PATIENT TYPE:  REC   LOCATION:  FOOT                         FACILITY:  [facility]   PHYSICIAN:  Jonelle Sports. Sevier, M.D. DATE OF BIRTH:  06/18/1946   DATE OF CONSULTATION:  07/29/2007  DATE OF DISCHARGE:                                 CONSULTATION   HISTORY:  This 65 year old black female is seen for multiple stasis  ulcerations of both lower extremities.  She had been treated in Unna  wraps, although these have slipped and create a bit of the itching.  She  has generally done well.  She is here today for routine follow-up with  the only complaint being that of the tendency of the wraps to slip and  also some itching where she feels maybe there was a little blister  formation high underneath the wrap on the posterior right calf.  She has  had no fever or systemic symptoms.  She has had no increased drainage,  no odor.   PHYSICAL EXAMINATION:  Blood pressure 150/62, pulse 77, respirations 20,  temperature 98.5.  Indeed the previous wounds on the dorsal aspect of  the left fifth toe and on the medial left lower leg have completely  resolved.  On the right lower leg and anteriorly on the left lower leg  laterally are two areas of open ulceration that are dramatically smaller  than before.  The exact dimensions are recorded in the chart and in the  wound expert.  Just proximal to the lateral lesion on the right-hand  side are three areas of encrustation, suggestive of possible new  underlying ulcerations.   IMPRESSION:  Quite satisfactory response to standard treatment of  venostasis ulcerations.   DISPOSITION:  Some areas of loose skin which apparently are the results  of prior inflammation are removed from some of the peri-ulcer areas.  The three peculiar satellite lesions proximal to the right lateral leg  ulcer are unroofed and indeed there are no significant open ulcerations  there.   The exact nature of these encrustations is unclear.   Because of the tendency of the Unna wraps to slip, the patient will be  treated with an application of triamcinolone for the itching (her skin  does not reflect the typical reaction to dome paste) and she will be  returned to the dome paste wrap but rather than a full Unna wrap, simply  the dome paste and this in turn will be covered by a Profore wrap.  This, hopefully, will result in the wraps remaining up better than  before.   Follow-up visit will be here in one week.           ______________________________  Jonelle Sports. Cheryll Cockayne, M.D.     RES/MEDQ  D:  07/29/2007  T:  07/30/2007  Job:  [id number]

## 2011-05-06 NOTE — H&P (Signed)
[name], [name]            ACCOUNT NO.:  [id number]   MEDICAL RECORD NO.:  [id number]          PATIENT TYPE:  INP   LOCATION:  4708                         FACILITY:  [facility]   PHYSICIAN:  [name], M.D.  DATE OF BIRTH:  [birth year]   DATE OF ADMISSION:  05/24/2007  DATE OF DISCHARGE:                              HISTORY & PHYSICAL   CHIEF COMPLAINT:  Bilateral leg edema.   HISTORY OF PRESENT ILLNESS:  This 65 year old black female has  progressive worsening of swelling in both legs along with erythematous  area over the dorsum of the left lower leg without any nausea or  vomiting or fever.   PAST MEDICAL HISTORY:  Diabetes for 20+ years.  Hypertension for 30+  years.  No history of smoking, alcohol intake or drug use.  Positive  history of elevated cholesterol level and obesity.  No history of  myocardial infarction, exercise, or family history of premature coronary  artery disease.   PAST SURGICAL HISTORY:  1. Tonsillectomy at age 59.  2. Appendectomy at age 50.  3. Cholecystectomy and ovary removal in 05/14/1972.  4. Hysterectomy in May 14, 1984.  5. Cardiac catheterization in 15-May-1995.  6. Right foot incision and drainage in 1998/05/14 and 14-May-2000.  7. 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   CURRENT MEDICATIONS:  1. Aspirin 325 mg one daily.  2. Nexium 40 mg one daily.  3. Synthroid 50 mcg one daily.  4. Lasix 80 mg twice daily.  5. KCl 20 mEq one three times daily.  6. Naprosyn 400 mg twice daily.  7. Glucotrol XL 10 mg one twice daily.  8. Metoprolol 50 mg one twice daily.  9. Clonidine 0.1 mg one twice daily.  10.Simvastatin 40 mg one daily.  11.Darvocet-N 100 one twice daily.  12.Lisinopril 20 mg one daily.  13.Humulin N 35 units in the morning and 15 units in the evening      subcutaneously.  14.Elavil 15 mg one at bedtime.   ALLERGIES:  1. PENICILLIN.  2. ANCEF.   PERSONAL HISTORY:  The patient is a widow, has a 11 year old son and 51-  year-old daughter.  The  patient has been disabled from back trouble.  Husband died of myocardial infarction in 05-15-2003.   FAMILY HISTORY:  Mother died of GI bleed and complication in 05-14-98.  Father died of some infection in 05/14/2001.  The patient has three brothers,  two of whom have diabetes and one sister who is alive and well.   REVIEW OF SYSTEMS:  The patient admits to chronic weight gain, vision  change - she wears glasses.  No history of cataract surgery or hearing  loss.  No tinnitus, rhinorrhea, cough, hemoptysis, asthma, COPD,  pneumonia or palpitations.  Positive history of wearing partial  dentures.  Occasional chest pain, recurrent leg edema, hiatal hernia and  joint pain.  No history of hepatitis, blood transfusions, kidney stones,  strokes, seizures or psychiatric admissions.   IMMUNIZATIONS:  Tetanus/diphtheria shot 8 years ago.   PHYSICAL EXAMINATION:  VITAL SIGNS:  Temperature 98, pulse 78,  respirations 16, blood pressure 130/70, height 5 feet 2  inches, weight  approximately 260 pounds.  GENERAL:  The patient is alert, oriented x3 and in no acute distress.  HEENT:  The patient is normocephalic, atraumatic, has brown eyes.  Pupils reacting to light.  Extraocular movement intact.  Conjunctivae  pink.  Ear, nose, throat grossly unremarkable.  NECK:  No JVD, no carotid bruit.  LUNGS:  Clear bilaterally but decreased air entry both lower lobes.  HEART:  Normal S1, S2 with grade 2/6 systolic murmur.  ABDOMEN:  Soft, distended and nontender.  EXTREMITIES:  No clubbing or cyanosis, 2+ edema extending up to both  knees and a 6 x 9-inch erythematous patch with tenderness over the shin  area of left leg.  Evidence of the right toe amputation.  CENTRAL NERVOUS SYSTEM:  Cranial nerves grossly intact.  The patient  moves all four extremities.   LABORATORY DATA:  Pending.   IMPRESSION:  1. Bilateral leg edema.  2. Cellulitis.  3. Diabetes mellitus type 2.  4. Hypertension.  5. Obesity.   PLAN:  Get  blood cultures, start IV antibiotics.  Start IV Lasix.  Continue home medications.      [name], M.D.  Electronically Signed     ASK/MEDQ  D:  05/24/2007  T:  05/25/2007  Job:  [id number]

## 2011-05-06 NOTE — Assessment & Plan Note (Signed)
[facility]   NAMEMarland Kitchen  CIANNI, MANNY            ACCOUNT NO.:  [id number]   MEDICAL RECORD NO.:  [id number]      DATE OF BIRTH:  1946/10/09   PHYSICIAN:  Theresia Majors. Tanda Rockers, M.D. VISIT DATE:  10/06/2007                                   OFFICE VISIT   SUBJECTIVE:  Ms. Picchi is a 65 year old diabetic with bilateral  stasis ulcerations.  In the interim we have treated her with sequential  debridements and compression wrap.  She returns for follow-up.  She  denies excessive drainage, malodor, pain or fever.   OBJECTIVE:  Inspection of the lower extremity shows that the previous  ulcers have completely resolved.  There is fresh re-epithelialization  over the previous wounds of the left lower extremity.  There is no  evidence of ascending infection or vascular compromise.   PLAN:  We are discharging the patient with instructions to continue the  use of external compression hose on a daily basis.  We have reviewed the  utilization of compression in the management of chronic stasis.  The  patient seems to understand.  We have given her an opportunity to ask  questions.  She indicates that she will be compliant.  She will keep her  appointments with Dr. Algie Coffer.      Harold A. Tanda Rockers, M.D.  Electronically Signed     HAN/MEDQ  D:  10/06/2007  T:  10/07/2007  Job:  [id number]   cc:   [name], M.D.

## 2011-05-06 NOTE — Discharge Summary (Signed)
[name], [name]            ACCOUNT NO.:  [id number]   MEDICAL RECORD NO.:  [id number]          PATIENT TYPE:  INP   LOCATION:  1408                         FACILITY:  [facility]   PHYSICIAN:  Orpah Cobb, M.D.     DATE OF BIRTH:  1945-12-29   DATE OF ADMISSION:  03/08/2008  DATE OF DISCHARGE:  03/14/2008                               DISCHARGE SUMMARY   DISCHARGE DIAGNOSES:  1. Acute on chronic gastrointestinal bleed.  2. Acute systolic and diastolic heart failure.  3. Chronic bilateral leg edema with cellulitis.  4. Diabetes mellitus type 2.  5. Hypertension.  6. Obesity.  7. Anemia of blood loss.   DISCHARGE MEDICATIONS:  1. Levothyroxine 50 mcg, 0.05 mg, one daily.  2. Clonidine 0.1 mg, one twice daily.  3. Metoprolol tartrate 50 mg, one twice daily.  4. Lisinopril 20 mg, one daily.  5. Simvastatin 20 mg, one daily in the evening.  6. Lasix 80 mg, one twice daily.  7. Potassium chloride 20 mEq, one daily.  8. Amitriptyline 50 mg at bedtime.  9. Aspirin 325 mg, one daily.  10.Glucotrol XL 10 mg, one twice daily.  11.Darvocet-N-100, one twice daily.  12.Humulin-N 30 units in the morning, 20 units in the evening.  13.Nexium 40 mg, one daily.  14.Zyrtec 10 mg, one daily.  15.Ferrous sulfate 325 mg, one twice daily.   NOTATION:  The patient is to notice the decreasing dose of potassium and  decreased dose of __________.   DIET:  A low-sodium, heart-healthy diet with 1500 mL fluid restriction,  plus a medium-calorie, carbohydrate-modified diet.   ACTIVITY:  The patient will walk with assistance with the walker.  The  patient to stop any activity that causes chest pain, shortness of  breath, dizziness, sweating or excessive weakness.   FOLLOWUP:  Follow up with Dr. [name] in one week.  The patient  to call [phone] for an appointment.   HISTORY:  This 65 year old black female who presented with shortness of  breath, weakness, along with bilateral leg edema.   The patient admitted  to discontinuing her Lasix dose for two to three weeks.   PAST MEDICAL HISTORY:  1. Positive for diabetes mellitus for 20+ years.  2. Hypertension for 30+ years.  3. Elevated cholesterol level.  4. Obesity for many years.   PHYSICAL EXAMINATION:  VITAL SIGNS:  Temperature 98.1 degrees, pulse 88,  respirations 18, blood pressure 177/66.  GENERAL:  The patient is alert and oriented x3, in no acute distress.  She is of average build and well-nourished.  HEENT:  Normocephalic and atraumatic.  She has brown eyes.  Pupils  equal, reactive to light.  Extraocular movements intact.  Conjunctivae  pink.  Sclerae anicteric.  Ears/Nose/Throat:  Grossly unremarkable.  Tongue midline, pale pink and wet.  NECK:  No jugular venous distention, no carotid bruit.  LUNGS:  Decreased air entry at both lower lobes.  HEART:  Normal S1 and S2 with a grade 2/6 systolic murmur at the left  sternal border.  ABDOMEN:  Soft, nontender.  EXTREMITIES:  No clubbing or cyanosis.  With 1-2+  edema extending up to  the knees with a heavy dressing applied to both lower extremities.  Evidence of right toe amputation.  NEUROLOGIC:  Grossly intact.  The patient moves all four extremities.  She is right-handed.   LABORATORY DATA:  Hemoglobin 9, hematocrit 27.2, WBC 11,200, platelets  483,000.  Beta natriuretic peptide elevated at 256.  Myoglobin elevated  at 245.  CK, MB, troponin I normal x2.  Sodium 139, potassium 5.2,  chloride 112, BUN 17, creatinine 1.2.  Hemoglobin A1c elevated at 10.6.  Albumin low at 2.8.  Subsequent hemoglobin was down to 7.8.  The patient  received 1 unit of blood.  Subsequent hemoglobin was 8.2 to 8.6.   A V/Q scan negative for pulmonary embolism or perfusion defect.   Colonoscopy negative for any source of bleeding.  Upper endoscopy also  negative for any source of bleeding with a mild esophageal inflammation.   HOSPITAL COURSE:  The patient was admitted to the  telemetry unit and  received IV Lasix for an early congestive heart failure.  Her hemoglobin  was found to be low.  Her GI consultation failed to show a source for  her bleed, in spite of her stool positive for occult blood.  Her  hemoglobin stabilized around 8 to 8.6.  She was found to be iron  deficient.  Her medications were adjusted.   DISPOSITION:  She was discharged home in satisfactory condition with  home wound care and PT and medications.           ______________________________  Orpah Cobb, M.D.     AK/MEDQ  D:  03/14/2008  T:  03/14/2008  Job:  [id number]

## 2011-05-06 NOTE — Assessment & Plan Note (Signed)
[facility]   NAMEMarland Kitchen  LYNDEN, CARRITHERS            ACCOUNT NO.:  [id number]   MEDICAL RECORD NO.:  [id number]      DATE OF BIRTH:  [birth year]   PHYSICIAN:  Jake Shark A. Tanda Rockers, M.D. VISIT DATE:  09/13/2007                                   OFFICE VISIT   SUBJECTIVE:  Ms. Maggard is a 65 year old lady who we are seeing for  bilateral stasis ulcers.  In the interim, we treated her with Unna wrap.  She returns complaining of swelling in the left leg with drainage and  possible injury related to the wrap.  There has been no interim fever or  malodor.   OBJECTIVE:  Blood pressure is 180/79, respirations 16, pulse rate 73,  temperature 97.1, capillary blood glucoses 97 mg percent.  Inspection of  the lower extremity shows that there is been improvement of ulcers 3A  and 4A.  On the left medial posterior leg, there are blisters related to  wrap injury.  These blisters were unroofed with drainage of the serous  fluid.  There is no evidence of ascending infection.  There remains 2+  edema.  Capillary refill is brisk and there is no evidence of ongoing  ischemia.   ASSESSMENT:  Clinical improvement of stasis ulcers with a wrap injury  due to interim swelling.   PLAN:  We will modify her wrap dressing to increase the dome's pace to  the high calf level.  We have re-educated the patient with the necessity  for elevations.  We have given the patient opportunity to ask questions.  She seems to understand, indicates that she will be compliant and  expresses gratitude for having been seen in the clinic.  We will  reevaluate the patient in one week in the nurses' clinic and monthly by  the physician.      Harold A. Tanda Rockers, M.D.  Electronically Signed     HAN/MEDQ  D:  09/13/2007  T:  09/13/2007  Job:  [id number]

## 2011-05-06 NOTE — H&P (Signed)
[name], [name]            ACCOUNT NO.:  [id number]   MEDICAL RECORD NO.:  [id number]          PATIENT TYPE:  INP   LOCATION:  1408                         [facility]ACILITY:  [facility]   PHYSICIAN:  [name], M.D.  DATE OF BIRTH:  1946-04-28   DATE OF ADMISSION:  03/08/2008  DATE OF DISCHARGE:                              HISTORY & PHYSICAL   CHIEF COMPLAINT:  Shortness of breath.   HISTORY OF PRESENT ILLNESS:  This is a 65 year old black female who had  progressive worsening of breathing along with chronic bilateral leg  edema.  The patient admitted to discontinuing her Lasix for 2-3 weeks.  In the emergency room the patient's condition improved without major  interventions and her VQ scan was negative for pulmonary embolism.   PAST MEDICAL HISTORY:  1. Diabetes mellitus for 20+ years.  2. Hypertension for 30+ years.  3. Negative history of smoking, alcohol, and drug use.  4. Positive history of elevated cholesterol level and obesity.  5. No history of myocardial infarction.   FAMILY HISTORY:  Coronary artery disease.   PAST SURGICAL HISTORY:  1. Tonsillectomy at age 65.  2. Appendectomy at age 50.  3. Cholecystectomy.  4. Ovarian cyst removal in 1973.  5. Hysterectomy in 1984/05/28.  6. Cardiac catheterization in 05-29-95.  7. Right foot incision and drainage in May 28, 1994 and 2000-05-28.  8. 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   CURRENT MEDICATIONS:  1. Aspirin 325 mg one daily.  2. Nexium 40 mg one daily.  3. Synthroid 50 mcg one daily.  4. Lasix 80 mg twice daily.  5. Potassium 20 mEq three times daily.  6. Glucotrol XL 10 mg one twice daily.  7. Metoprolol 50 mg one twice daily.  8. Clonidine 0.1 mg twice daily.  9. Simvastatin 20 mg at bedtime.  10.Darvocet-N 100 one twice daily.  11.Lisinopril 20 mg one daily.  12.Humulin N 40 units in the morning and 20 units in the evening.  13.Elavil 50 mg at bedtime.   ALLERGIES:  1. PENICILLIN.  2. NSAIDS.   PERSONAL HISTORY:  The  patient is widowed, has a 4 year old son and 81-  year-old daughter.  She is disabled from back trouble.  Husband died of  myocardial infarction in May 29, 2003.   FAMILY HISTORY:  Mother died of GI bleed and complications in 05/28/98.  Father died of some infection in 05-28-01.  The patient has three brothers,  two of whom have diabetes mellitus, and one sister who is alive and  well.   REVIEW OF SYSTEMS:  The patient admits to chronic weight gain, vision  change.  She wears glasses.  No history of cataract surgery.  She denies  hearing loss.  No history of tinnitus, rhinorrhea, cough, hemoptysis.  Denies COPD, pneumonia, palpitations.  Positive history of wearing  partial dentures.  No chest pain.  Recurrent leg edema, hiatal hernia,  and joint pains.  The patient does not have any history of hepatitis,  blood transfusion, kidney stones, strokes, seizures or psychiatric  admissions.  Immunization not up to date.   PHYSICAL EXAMINATION:  VITAL SIGNS:  Temperature  is 98.1, pulse 88,  respirations 18, blood pressure 177/66.  GENERAL:  The patient is alert, oriented x3, in no acute distress.  HEENT:  The patient is normocephalic, atraumatic.  She has brown eyes.  Pupils equal and reactive to light.  Extraocular movements intact.  Conjunctivae pink, sclerae anicteric.  Ears, nose, throat:  Grossly  unremarkable.  Tongue midline and pink and wet.  NECK:  No JVD or carotid bruit.  LUNGS:  Decreased air entry at both lower lobes.  HEART:  Palpable S1/S2 with grade 2/6 systolic murmur at left sternal  border.  ABDOMEN:  Soft, distant, nontender.  EXTREMITIES:  No clubbing or cyanosis, 1-2+ edema extending up to the  knees.  She has a heavy dressing applied to both lower extremities with  evidence of right toe amputation.  NEUROLOGIC:  Grossly intact.  The patient moves all four extremities and  she is right-handed.   LABORATORY DATA:  Hemoglobin 9, hematocrit 27.2, WBC count slightly  elevated at  11,200, platelet count 483,000.  B natriuretic peptide  slightly elevated at 256.  Myoglobin slightly high at 245.  CK-MB  normal.  Troponin I normal at 0.05 x2.  Sodium 139, potassium 5.2,  chloride 112, BUN 17, creatinine was 1.2.  INR was 1.  VQ scan negative  for pulmonary embolism or perfusion defect.   IMPRESSION:  1. Early acute systolic left heart failure.  2. Chronic bilateral leg edema and cellulitis.  3. Diabetes mellitus, type 2.  4. Hypertension.  5. Obesity.   PLAN:  Admit the patient to telemetry unit.  Give IV Lasix and continue  home medications.      [name], M.D.  Electronically Signed     ASK/MEDQ  D:  03/08/2008  T:  03/09/2008  Job:  [id number]

## 2011-05-06 NOTE — Discharge Summary (Signed)
[name], [name]            ACCOUNT NO.:  [id number]   MEDICAL RECORD NO.:  [id number]          PATIENT TYPE:  INP   LOCATION:  4708                         FACILITY:  [facility]   PHYSICIAN:  [name], M.D.  DATE OF BIRTH:  [birth year]   DATE OF ADMISSION:  05/24/2007  DATE OF DISCHARGE:  05/28/2007                               DISCHARGE SUMMARY   FINAL DIAGNOSES:  1. Cellulitis of the leg.  2. Chronic systolic and diastolic heart failure.  3. Diabetes mellitus type 2.  4. Hypertension.  5. Obesity.  6. Hypercholesterolemia.  7. Long time user of insulin.  8. Long time user of aspirin.  9. History of fall.  10.Clavicle shaft closed fracture.   DISCHARGE MEDICATIONS:  1. Lasix 80 mg 1 twice daily.  2. Lisinopril 20 mg 1 daily.  3. Levothroid 50 mcg 1 daily.  4. Aspirin 625 mg daily.  5. Nexium 40 mg daily.  6. K-Dur 20 mEq 2 twice daily.  7. Glucotrol XL 10 mg twice daily.  8. Clonidine 0.1 mg twice daily.  9. Elavil 50 mg at bedtime.  10.Humulin-N 40 units subcutaneously in a.m. and 20 units      subcutaneously in p.m.  11.IV vancomycin 1 gram daily x6 days.  12.Darvocet-N 100 one four times daily.  13.Zocor 40 mg 1 daily.  14.Discharge activity.  The patient is to increase activity slowly.   DISCHARGE DIET:  Low sodium, heart healthy diet with a 1200 mL fluid  restriction per day.   WOUND CARE:  The patient is to have dressing change per the wound care  counselor.   SPECIAL INSTRUCTIONS:  The patient is to stop any activity that causes  chest pain, shortness of breath, dizziness, sweating or excessive  weakness and follow heart failure instruction booklet and weight chart.  The patient is to elevate the legs as much as possible.  Followup with  Dr. Orpah Cobb in 6 days.  The patient is to call [phone] for  appointment.   HISTORY:  This [name] presented with bilateral leg  edema along with some erythema of the dorsum of the left  lower leg  without nausea, vomiting or fever.  The patient has long standing  history of diabetes and had similar infections in the past.   PHYSICAL EXAMINATION:  VITAL SIGNS:  Temperature 98, pulse 78,  respirations 16, blood pressure 130/70, height 5'2, weight  approximately 260 pounds.  GENERAL:  The patient is alert and oriented x3 in no acute distress.  HEENT:  The patient is normocephalic, atraumatic with brown eyes.  Pupils reactive to light.  Extraocular movements intact.  Conjunctiva  pink.  Ears, nose, throat grossly unremarkable.  NECK:  No JVD.  No carotid bruits.  LUNGS:  Clear bilaterally but with a decreased air entry in both lower  lobes.  HEART:  Normal S1, S2 with grade 2/6 systolic murmur.  ABDOMEN:  Soft, distended and nontender.  EXTREMITIES:  No clubbing or cyanosis.  2+ edema extending up to both  knees with a 6 inch x 9 inch erythematous patch with tenderness over the  shin area of the left leg and evidence of right toe amputation.  CNS:  Cranial nerves grossly intact.  The patient moves all four  extremities.   LABORATORY DATA:  Borderline WBC count of 10,800, hemoglobin low at 9.4,  hematocrit 28.8, platelet count 488,000.  Sodium 137, potassium 4.9, BUN  25, creatinine 135, glucose elevated at 162.  Albumin low at 2.8,  alkaline phosphatase 148.  Blood culture grew staphylococcus species  resistant to Clindamycin and penicillin and sensitive to all other  antibiotics.  Vancomycin trough level was 14.3.  X-ray of the chest was unremarkable.  EKG showed normal sinus rhythm.   HOSPITAL COURSE:  The patient was admitted to regular floor.  Blood  cultures were drawn.  IV antibiotic Zosyn was started per pharmacy  dosing.  This was then changed to vancomycin 1 gram IV q. 24 hours due  to history of penicillin allergy.  The patient's condition improved  slowly over the next 4 days of hospitalization.  She had developed  diarrhea for which stool for C-difficile  toxin was ordered.  Stool for  occult blood was ordered.  Imodium AD was given for her loose bowel  motion.  Wound care consult was obtained.  Her sugar level was  controlled with sliding scale insulin ordered.  It was decided to send  the patient home with IV vancomycin for at least one week at home and  her diarrhea has resolved so she was discharged home on May 28, 2007,  with follow up by me in 6 to 7 days.      [name], M.D.  Electronically Signed     ASK/MEDQ  D:  07/07/2007  T:  07/08/2007  Job:  [id number]

## 2011-05-06 NOTE — Assessment & Plan Note (Signed)
[facility]   NAMEMarland [name]  LARENE, ASCENCIO            ACCOUNT NO.:  [id number]   MEDICAL RECORD NO.:  [id number]      DATE OF BIRTH:  [birth year]   PHYSICIAN:  Maxwell Caul, M.D. VISIT DATE:  02/04/2008                                   OFFICE VISIT   [name] is a lady we have been following here for bilateral venous  stasis on her lower extremities.  When seen last week she had two  remaining wounds that we applied Prisma and hydrogel to.  She has been  receiving Unna wraps and there has been improvement.   On examination, temperature is 97.5, pulse 73, respirations 18, blood  pressure 157/87.  The two areas are on the right lateral lower extremity  which we have labeled #6 and the left medial lower extremity we have  labeled #7.  Both of these were initially covered with a thick black  eschar.  These underwent a difficult debridement removing the eschar,  some surrounding skin and some subcutaneous tissues to both of the  wounds got down to a cleaner base.  There was no evidence of surrounding  infection here and nothing needed culturing.  We used lidocaine for  anesthesia and #15 blades, hemostasis with direct pressure.   As an aside, the patient was listless through the debridements.  Her  blood sugar registered at 426.  She was advised to contact Dr. Algie Coffer,  who is her primary care physician, about this.   IMPRESSIONS:  Bilateral venous stasis ulcerations.  I applied an  Accuzyme, Vaseline to the surrounding skin, and Unna wrapped these  bilaterally.  Hopefully, we can get a cleaner base to apply a different  dressing to.  This is the second week in a row we have had difficult  debridements.           ______________________________  Maxwell Caul, M.D.     MGR/MEDQ  D:  02/04/2008  T:  02/06/2008  Job:  [id number]

## 2011-05-06 NOTE — Consult Note (Signed)
[name], [name]            ACCOUNT NO.:  [id number]   MEDICAL RECORD NO.:  [id number]          PATIENT TYPE:  INP   LOCATION:  4708                         FACILITY:  [facility]   PHYSICIAN:  Erasmo Leventhal, M.D.DATE OF BIRTH:  September 15, 1946   DATE OF CONSULTATION:  DATE OF DISCHARGE:                                 CONSULTATION   HISTORY OF PRESENT ILLNESS:  [name] is a 65 year old female  currently admitted by Dr. Algie Coffer with a diagnosis of cellulitis.  It  was noted that she was having some shoulder pain.  An x-ray was taken of  the right shoulder, and she was diagnosed as having a clavicle fracture.  Dr. Ranell Patrick is her treating physician for her right shoulder, and I was  consulted this evening.   I evaluated the patient in room 4708 where she was a very pleasant lady  awake, alert, and oriented to person, place, time, and circumstance.  She was a relatively fair historian, and that with my office notes  revealed that she has had multiple falls recently.  On or about May  24th, she had a fall sustaining right shoulder injury.  She saw Dr.  Ranell Patrick at [facility] on May 27th where he diagnosed  her as having a clavicle fracture and recommended a sling and follow-up  in four weeks on June 25th.  The patient states that at this point in  time she is having some mild shoulder pain, but this seems to be of  minimal complaint with the other multiple problems that she has.   ALLERGIES:  PENICILLIN AND ANCEF.   CURRENT MEDICATIONS:  KCl, Synthroid, Clonidine, Lopressor, lisinopril,  glipizide, simvastatin, Darvocet, Lasix, amitriptyline, insulin, and  currently on vancomycin.   PAST MEDICAL HISTORY:  Well-documented in the chart consisting of left  leg cellulitis, chronic edema, diabetes mellitus, hypertension, obesity,  chronic systolic/diastolic left heart failure.   Past medical history and surgical history as noted above.   PHYSICAL EXAMINATION:   Very pleasant lady, alert and oriented to person,  place, time, and circumstance.  Large body habitus.  The right  supraclavicular region is palpable.  Skin is intact.  Fracture site is  palpable and mildly tender.  She can actively forward elevate to about  110 degrees.  She has a normal neurovascular examination distally in the  hand with no evidence of neurovascular compromise.  Plain x-rays of the  mid-clavicle fracture show some over-riding.   IMPRESSION:  Right mid-shaft clavicle fracture approximately 16 days old  and currently being followed by Dr. Ranell Patrick in our office.   RECOMMENDATIONS:  At this point in time, her body habitus would preclude  utilization of the clavicle strap, and at this time the patient does not  need a sling.  She has actually discarded it herself.  I would simply  recommend  that she realize she has a clavicle fracture as does the remaining staff  and use her arm as pain allows as necessary.  She will follow up with  Dr. Ranell Patrick on June 16, 2007 for her regularly scheduled appointment  which she already has  at our office and she knows about.  For further  questions, please call our office.           ______________________________  Erasmo Leventhal, M.D.     RAC/MEDQ  D:  05/25/2007  T:  05/25/2007  Job:  [id number]   cc:   [facility]  [name], M.D.

## 2011-05-06 NOTE — Assessment & Plan Note (Signed)
[facility]   NAMEMarland Kitchen  [name], [name]            ACCOUNT NO.:  [id number]   MEDICAL RECORD NO.:  [id number]      DATE OF BIRTH:  12/22/1946   PHYSICIAN:  Maxwell Caul, M.D. VISIT DATE:  02/11/2008                                   OFFICE VISIT   [name] is a lady we have been following here for difficult venous  stasis ulcers on the lower extremities.  Over the last 2 visits, I have  had to do difficult debridements of adherent eschar to both of these  wounds.  Last week we applied Accuzyme and continued her in her Unna  wraps.  She returns today in follow-up.   WOUND EXAM:  Temperature is 93, pulse 74, respirations 18, blood  pressure 172/77.  The wound on the right lateral lower extremity measures 2.5 x 2.5 x 0.2.  This now has a much cleaner base.  The one on the left medial lower  extremity measuring 1.5 x 3 also appears to have a healthier base to it,  although not as clean as the right side.  There is no evidence of  cellulitis in either one of these wounds.   We applied Silverlon and hydrogel to both of these wounds and repeated  the Unna wraps.  No debridement was necessary today.  Nothing needed  culturing.           ______________________________  Maxwell Caul, M.D.     MGR/MEDQ  D:  02/11/2008  T:  02/12/2008  Job:  [id number]

## 2011-05-06 NOTE — H&P (Signed)
[name], [name]            ACCOUNT NO.:  [id number]   MEDICAL RECORD NO.:  [id number]          PATIENT TYPE:  INP   LOCATION:  1437                         FACILITY:  [facility]   PHYSICIAN:  [name], M.D.  DATE OF BIRTH:  04-11-1946   DATE OF ADMISSION:  03/23/2008  DATE OF DISCHARGE:                              HISTORY & PHYSICAL   CHIEF COMPLAINT:  Shortness of breath.   HISTORY OF PRESENT ILLNESS:  This 65 year old black female had a 1-day  history of shortness of breath on lying down.  The patient had recent  admission to the hospital for a similar condition.   PAST MEDICAL HISTORY:  Diabetes mellitus for 20+ years, hypertension for  30+ years, no history of smoking cigarette, alcohol intake or drug use.  Positive history of elevated cholesterol level and obesity.  No history  of myocardial infarction.   FAMILY HISTORY:  Positive for coronary artery disease.   PAST SURGICAL HISTORY:  1. Tonsillectomy at age 49, appendectomy at age 54, cholecystectomy,      ovarian cyst removal in 1972/05/31.  2. Hysterectomy in 1984/05/31.  3. Cardiac catheterization in 06/01/95.  4. Right foot incision and drainage in May 31, 1994 and May 31, 2000 and a right toe      amputation in February 2007.   CURRENT MEDICATIONS:  1. Levothyroxine 50 mcg one daily.  2. Clonidine 0.1 mg twice daily.  3. Metoprolol tartrate 50 mg one twice daily.  4. Lisinopril 20 mg one daily.  5. Simvastatin 20 mg one daily in the evening.  6. Lasix 80 mg one twice daily.  7. Potassium chloride 20 mEq one daily.  8. Amitriptyline 50 mg at bedtime.  9. Aspirin 325 mg daily.  10.Glucotrol XL 10 mg one twice daily.  11.Darvocet-N 100 one twice daily.  12.Humulin N 30 units in the morning, 20 units in the evening.  13.Nexium 40 mg one daily.  14.Zyrtec 10 mg one daily.  15.Ferrous sulfate 325 mg one twice daily.  16.Glucotrol XL 10 mg one twice daily.   ALLERGIES:  PENICILLIN and NSAID.   PERSONAL HISTORY:  The patient is  widowed, has a  32 year old son and 70-  year-old daughter. She is disabled from back troubled  and husband died  of myocardial infarction in  2003/06/01.   FAMILY HISTORY:  Mother died of GI bleed and complications in 1998/05/31.  Father died of some infection in 2001-05-31.  The patient has three brothers,  two of whom have diabetes mellitus and one has peripheral vascular  disease. He has one sister who is alive and well.   REVIEW OF SYSTEMS:  The patient admits to chronic weight gain, vision  changes from diabetes.  She wears glasses, has a history of cataract  surgery.  Denies hearing loss.  No history of tinnitus, rhinorrhea,  cough, hemoptysis. No history of COPD, pneumonia, palpitations.  Positive history of wearing partial dentures.  No history of chest pain.  Positive history of recurrent leg edema, hiatal hernia and joint pains.  The patient does not have any history of hepatitis, kidney stones,  strokes, seizures or psychiatric admissions.  Has a history of blood  transfusions on last admission and immunization is not up-to-date.   PHYSICAL EXAMINATION:  VITAL SIGNS:  Temperature 98, pulse 90,  respirations 20, blood pressure 170/60.  GENERAL:  The patient is alert and oriented x3 in no acute distress.  HEENT:  The patient is normocephalic, atraumatic with brown eyes which  are briskly reactive to light.  Extraocular movement intact.  Conjunctiva pale pink.  Sclerae nonicteric.  Ears, nose and throat  grossly unremarkable with midline tongue  NECK:  No JVD noted, no bruit.  LUNGS:  Decreased air entry at both bases with wheezes and crackles.  HEART:  Normal S1, S2 with a 4/6 systolic murmur at the left sternal  border.  ABDOMEN:  Soft, distended but nontender.  EXTREMITIES:  No clubbing or cyanosis.  1 to 2+ edema extending into the  knees.  She has heavy dressing applied  to both lower extremities with  evidence of right toe amputation.  NEUROLOGICAL:  Cranial nerves grossly intact.  The  patient moves all  four extremities and is right-handed.   LABORATORY DATA:  Pending.   IMPRESSION:  1. Acute on chronic systolic and diastolic heart failure.  2. Chronic bilateral leg edema and cellulitis.  3. Diabetes mellitus type 2.  4. Hypertension.  5. Obesity.   PLAN:  Admit the patient to telemetry unit, give IV Lasix and continue  most of her home medications. Decrease the dose of aspirin if possible.      [name], M.D.  Electronically Signed     ASK/MEDQ  D:  03/23/2008  T:  03/24/2008  Job:  [id number]

## 2011-05-06 NOTE — H&P (Signed)
[name], [name]            ACCOUNT NO.:  [id number]   MEDICAL RECORD NO.:  [id number]          PATIENT TYPE:  INP   LOCATION:  1537                         [facility]ACILITY:  [facility]   PHYSICIAN:  [name], M.D.  DATE OF BIRTH:  11-May-1946   DATE OF ADMISSION:  02/12/2008  DATE OF DISCHARGE:                              HISTORY & PHYSICAL   CHIEF COMPLAINT:  Bilateral leg edema.   HISTORY OF PRESENT ILLNESS:  This 65 year old black female has  progressive worsening of swelling in both legs, along with weeping of  old wounds in both lower extremities. The patient is followed by wound  care center and also has exertional dyspnea. No fever, nausea, vomiting,  or chest pain.   PAST MEDICAL HISTORY:  Diabetes mellitus for 20+ years, hypertension for  30+ years. No history of smoking, alcohol intake, or drug use. Positive  history of elevated cholesterol level and obesity. No history of  myocardial infarction, exercise or family history of premature coronary  artery disease.   PAST SURGICAL HISTORY:  Tonsillectomy at age 36.  1. Appendectomy at age 71.  2. Cholecystectomy and ovarian removal in 05-09-1972.  3. Hysterectomy in 1985.  4. Cardiac catheterization in May 10, 1995.  5. Right foot incision and drainage in 05/09/98 and 05-09-2000.  6. 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   CURRENT MEDICATIONS:  1. Aspirin 325 mg 1 daily.  2. Nexium 40 mg 1 daily.  3. Synthroid 50 mcg 1 daily.  4. Lasix 80 mg 1 twice daily.  5. KCL (potassium) 20 meq 1 three times daily.  6. Glucotrol XL 10 mg 1 twice daily.  7. Metoprolol 50 mg 1 twice daily.  8. Clonidine 0.1 mg 1 twice daily.  9. Simvastatin 20 mg 1 daily.  10.Darvocet N 100 1 twice daily.  11.Lisinopril 20 mg 1 daily.  12.Humulin N 40 units in the morning and 20 units in the evening      subcutaneously.  13.Elavil 50 mg 1 at bedtime.   ALLERGIES:  PENICILLIN, NSAID.   PERSONAL HISTORY:  The patient is a widow. Has a 67 year old son and 73-  year-old daughter. She has been disabled from back trouble. Husband died  of myocardial infarction in 2003-05-10.   FAMILY HISTORY:  Mother died of GI bleed and complications in 05/09/98.  Father died of some infection in May 09, 2001. The patient has 3 brothers, 2 of  whom have diabetes mellitus and 1 sister who is alive and well.   REVIEW OF SYSTEMS:  The patient admits to chronic weight gain, vision  change. The patient wears glasses. No history of cataract surgery.  Denies hearing loss. No history of tinnitus, rhinorrhea, cough,  hemoptysis, asthma, COPD, pneumonia or palpitations. Positive history of  wearing partial dentures. Occasional chest pain. Recurrent leg edema.  Hiatal hernia and joint pains. The patient denies any history of  hepatitis, blood transfusion, kidney stones, stroke, seizure or  psychiatric admissions.   IMMUNIZATIONS:  Had tetanus, diphtheria shot 9 years ago.   PHYSICAL EXAMINATION:  VITAL SIGNS:  Temperature 97.5, pulse 76,  respiratory rate 16, blood pressure 174/95.  Height 5 feet 2 inches.  Weight approximately 261 pounds.  GENERAL:  The patient is alert and oriented x3. No acute distress.  HEENT:  Normocephalic and atraumatic. Has brown eyes. Pupils are equal,  round, and reactive to light. Extraocular muscles intact. Conjunctivae  pink. Sclerae nonicteric. Ear, nose, and throat grossly unremarkable.  NECK:  No JVD, no carotid bruit.  LUNGS:  Clear bilaterally with decreased air entry both lower lobes.  HEART:  Normal S1 and S2 with grade 2/6 systolic murmur.  ABDOMEN:  Soft, distended but nontender.  EXTREMITIES:  No clubbing or cyanosis. 2+ edema extending up to both  knees and small healing ulcer both lower extremities. Evidence of right  toe amputation.  NEUROLOGIC:  Cranial nerves grossly intact. The patient moves all 4  extremities. She is right handed.   LABORATORY DATA:  Pending.   IMPRESSION:  1. Bilateral leg edema.  2. Possible cellulitis.  3. Diabetes  mellitus type 2.  4. Hypertension.  5. Obesity.   PLAN:  To get blood cultures. Start IV antibiotics. Start IV Lasix.  Continue most of the home medications.      [name], M.D.  Electronically Signed     ASK/MEDQ  D:  02/12/2008  T:  02/13/2008  Job:  [id number]

## 2011-05-06 NOTE — Discharge Summary (Signed)
NAMECARNELLA, [name]            ACCOUNT NO.:  [id number]   MEDICAL RECORD NO.:  [id number]          PATIENT TYPE:  OBV   LOCATION:  1429                         [facility]ACILITY:  [facility]   PHYSICIAN:  [name], M.D.  DATE OF BIRTH:  1946-08-16   DATE OF ADMISSION:  12/31/2008  DATE OF DISCHARGE:  01/02/2009                               DISCHARGE SUMMARY   FINAL DIAGNOSES:  1. Chest pain.  2. Hypertension.  3. Obesity.  4. Diabetes mellitus type 2.  5. Anxiety.   DISCHARGE MEDICATIONS:  1. Aspirin 325 mg 1 daily.  2. Nexium 40 mg 1 daily.  3. Synthroid 0.05 mg 1 daily.  4. Lasix 80 mg 1 twice daily.  5. KCl 20 mEq three times a day.  6. Naprosyn 500 mg twice daily.  7. Glucotrol 10 mg twice daily.  8. Metoprolol 50 mg twice daily.  9. Clonidine 0.1 mg twice daily.  10.Simvastatin 40 mg at bedtime.  11.Darvocet N 100 one twice daily.  12.Lisinopril 20 mg 1 daily.  13.Humulin N 30 units in the morning and 15 in the evening.  14.Patient to adjust insulin.  If her fasting blood sugar is less than      90 mg, she is to decrease the insulin by 2.  She is to keep the      insulin level the same if the blood sugar is between 90-190, and      increase the insulin by 2 units a.m. and p.m. if the blood sugar is      more than 190 mg/dL.   DISCHARGE DIET:  Low-sodium, heart-healthy and diabetic diet,  carbohydrate modified, medium calorie.   DISCHARGE ACTIVITY:  Increase activity slowly.  The patient to stop any  activity that causes chest pain, shortness of breath, dizziness,  sweating spell or excessive weakness.   FOLLOW-UP:  Dr. Orpah Cobb in 1 month.  The patient to call [phone]  for an appointment.   HISTORY:  This 65 year old black female presented with a complaint of  chest pain along with nausea.  Some of the chest pain increased with  deep breathing and palpation.  The patient has a strong history of  diabetes, hypertension, elevated cholesterol level and has  chronic lower  extremity ulcers and decreased vascularity.   PHYSICAL EXAMINATION:  VITAL SIGNS:  Temperature 97.5, pulse 69,  respirations 18, blood pressure 160/78.  The patient is 5 feet 2 inches  tall and weighs approximately 231 pounds.  GENERAL:  The patient is averagely built, well-nourished.  HEENT:  The patient is normocephalic, atraumatic.  Has brown eyes.  Has  bilateral lens implants.  NECK:  No JVD.  LUNGS:  Clear bilaterally with decreased air entry at both bases with  mild tenderness on palpation anteriorly.  HEART:  Normal S1-S2 with a grade 2/6 systolic murmur.  ABDOMEN:  Soft and distended.  EXTREMITIES:  1+ edema.  Negative cyanosis or clubbing.  Chronic venous  stasis changes in the lower extremity.  SKIN:  Warm and dry.  NEUROLOGICAL:  The patient moves all four extremities.   LABORATORY DATA:  Normal WBC  count and platelet count.  Hemoglobin  slightly low at 10, hematocrit 30.5.  Normal electrolytes.  BUN 42,  creatinine 2.2, sugar 140, myoglobin 174, MB 1.6, troponin-I of 0.05.   Chest x-ray:  Cardiomegaly with no acute process.   EKG:  Normal sinus rhythm.   2-D echocardiogram showed moderate left hypertrophy.   Nuclear stress test negative for reversible ischemia.   HOSPITAL COURSE:  The patient was admitted to telemetry unit.  Myocardial infarction was ruled out.  She underwent a nuclear stress  test that failed to show any reversible ischemia.  The patient had an  episode of hypoglycemia, hence her insulin doses was decreased and she  was discharged home in satisfactory condition on January 02, 2009, with  follow-up by me in 1 month.      [name], M.D.  Electronically Signed     ASK/MEDQ  D:  01/02/2009  T:  01/02/2009  Job:  [id number]

## 2011-05-09 NOTE — H&P (Signed)
[name], [name]            ACCOUNT NO.:  [id number]   MEDICAL RECORD NO.:  [id number]          PATIENT TYPE:  INP   LOCATION:  5014                         FACILITY:  [facility]   PHYSICIAN:  [name], M.D.  DATE OF BIRTH:  22-Aug-1946   DATE OF ADMISSION:  06/06/2005  DATE OF DISCHARGE:                                HISTORY & PHYSICAL   CHIEF COMPLAINT:  Left leg swelling and redness.   HISTORY OF PRESENT ILLNESS:  This 65 year old black female has a 2-day  history of bilateral lower leg swelling with redness and a blister formation  in the left lower leg and minimal redness of the right lower leg. The  patient had history of cellulitis in the past and most antibiotics give her  a rash except for the Cipro and the vancomycin and she responds to those  antibiotics very well.   PAST MEDICAL HISTORY:  1.  Positive for diabetes mellitus for19 years.  2.  Hypertension for 30+ years.  3.  No history of smoking or alcohol intake or drug use.  4.  Positive history of elevated cholesterol.  5.  Obesity.  6.  No history of myocardial infarction.  7.  Lack of exercise and no family history of premature coronary artery      disease.   PAST SURGICAL HISTORY:  Tonsillectomy at age 46; appendectomy age 73;  polycystic ovary surgery in 05-07-1972; gallbladder surgery in 05-07-1982; hysterectomy  in May 07, 1984; cardiac catheterization in 1995/05/08; right foot incision and drainage  in 1998/05/07 and 05/07/00.   CURRENT MEDICATIONS:  1.  Lasix 40 mg daily.  2.  K-Dur 20 b.i.d.  3.  Lopressor 50 mg b.i.d.  4.  Glucotrol XL 10 mg b.i.d.  5.  Lisinopril 10 mg daily.  6.  Clonidine 0.1 mg b.i.d.  7.  Amitriptyline 50 mg h.s.  8.  Synthroid 50 mcg daily.   ALLERGIES:  PENICILLIN, ANCEF.   PERSONAL HISTORY:  The patient is a widow. Has one 38 year old son and one  55 year old daughter. She is disabled from back trouble. Husband died of  myocardial infarction 2 years ago.   FAMILY HISTORY:  Mother died of a GI  bleed in 05-07-1998. Father died of some  infection in 2001/05/07. The patient has 3 brothers, 2 have diabetes. She has one  sister who is alive and well.   REVIEW OF SYSTEMS:  The patient admits to weight gain. Has vision changes,  wears reading glasses. No history of cataract surgery, hearing loss,  tinnitus, rhinorrhea, cough, hemoptysis, asthma, COPD, pneumonia or  palpitations. Positive history for partial dentures. Occasional chest pain.  Recurrent leg edema. Hiatal hernia. Joint pains. No history of hepatitis,  blood transfusion, kidney stone, stroke, seizures or psychiatric admissions.   IMMUNIZATIONS:  Up to date with tetanus diphtheria shot 6 years ago. Flu  shot 6 months ago. No history of pneumonia shot.   PHYSICAL EXAMINATION:  VITAL SIGNS:  Temperature 97.8, pulse 70, respiration  18, blood pressure 120/50. Height 5 feet, 2 inches. Weight 250 pounds  approximately.  GENERAL:  The patient is alert and oriented x3  with 98% saturation on room  air.  HEENT:  Head normocephalic, atraumatic. Eyes are brown, pupils react to  light. Extraocular movements are intact. Conjunctivae pale. Ears, nose,  throat - mucous membranes are pink.  NECK:  No jugular venous distension, no carotid bruit.  LUNGS:  Clear bilaterally.  HEART:  Normal S1, S2 with a grade 2/6 systolic murmur.  ABDOMEN:  Soft, distended but nontender.  EXTREMITIES:  No clubbing, cyanosis, there is 2+ edema with anterior redness  on the right lower extremity, and an area of redness with a small blister  formation in the left lower extremity.  CNS:  Cranial nerves II-XII grossly intact. Moves all four extremities.   LABORATORY DATA:  Pending.  Chest x-ray pending.  EKG pending.   ASSESSMENT:  1.  Bilateral lower extremity cellulitis.  2.  Diabetes mellitus type 2.  3.  Hypertension.  4.  Obesity.   PLAN:  Start intravenous antibiotic after blood cultures and fluid from the  left lower leg wound culture. Continue home  medications. Give IV Lasix for  some diuresis.       ASK/MEDQ  D:  06/06/2005  T:  06/07/2005  Job:  [id number]

## 2011-05-09 NOTE — Discharge Summary (Signed)
[name], [name]            ACCOUNT NO.:  [id number]   MEDICAL RECORD NO.:  [id number]          PATIENT TYPE:  INP   LOCATION:  0102                         FACILITY:  [facility]   PHYSICIAN:  [name], M.D.  DATE OF BIRTH:  14-Dec-1946   DATE OF ADMISSION:  12/30/2008  DATE OF DISCHARGE:  01/02/2009                               DISCHARGE SUMMARY   FINAL DIAGNOSES:  1. Chest pain.  2. Morbid obesity.  3. Hypertension.  4. Diabetes mellitus.   DISCHARGE MEDICATIONS:  1. Aspirin 325 mg 1 daily.  2. Nexium 40 mg 1 daily.  3. Synthroid 0.05 mg 1 daily.  4. Lasix 80 mg 1 twice daily.  5. Kay Ciel 20 mEq 3 times daily.  6. Naprosyn 500 mg 1 twice daily.  7. Glucotrol 10 mg 1 twice daily.  8. Metoprolol 50 mg 1 twice daily.  9. Clonidine 0.1 mg twice daily.  10.Simvastatin 40 mg 1 at bedtime.  11.Darvocet-N 100 one twice daily.  12.Lisinopril 20 mg 1 daily.  13.Humulin N 30 units in the morning and 15 units in the evening.   Patient will decrease insulin by 2 units every a.m. and p.m. if blood  sugar is less than 90 mg but she will keep the same if the blood sugar  is between 90 and 190 mg and increase by 2 units in the morning and  evening if her blood sugar is over 190 mg/dL.   DISCHARGE DIET:  Low-sodium, heart-healthy diet and carbohydrate-  modified diet.   DISCHARGE ACTIVITY:  Patient to increase activity slowly.   SPECIAL INSTRUCTIONS:  Patient to stop any activity that causes chest  pain, shortness of breath, dizziness, sweating, or excessive weakness.   FOLLOWUP:  With Dr. Orpah Cobb in 1 month.  Patient or family to call  [phone] for appointment.   HISTORY:  This 65 year old black female presented with chest pain,  shortness of breath.  The chest pain was substernal, increased with deep  breathing and also increased with palpation.  She has a past history of  diabetes for 25 years, hypertension for 30 years.  No history of  smoking, alcohol intake, or  drug use.  Has history of elevated  cholesterol level and known history of coronary artery disease.   PHYSICAL EXAMINATION:  Temperature 97.5.  Pulse 69.  Respirations 18.  Blood pressure 160/78.  Height 5 feet 2 inches.  Weight 231 pounds.  GENERAL:  Patient is averagely built and well nourished.  HEENT:  Patient is normocephalic, atraumatic, has brown eyes and  bilateral lens implants.  NECK:  No JVD.  CHEST:  Mild tenderness on palpation.  Otherwise, lungs clear but  decreased air entry at both bases.  HEART:  Normal S1 and S2 with grade 2/6 systolic murmur.  ABDOMEN:  Soft and distended but nontender.  EXTREMITIES:  Revealed 1+ edema.  Negative cyanosis.  Negative clubbing.  Chronic venous stasis changes of lower extremities.  SKIN:  Warm and dry.  NEUROLOGICAL:  Patient moved all 4 extremities.   LABORATORY DATA:  Normal WBC count, platelet count, hemoglobin slightly  low at  10, hematocrit 30.5.  Electrolytes normal.  BUN 42, creatinine  2.2.  CK-MB and troponin I negative.  Chest x-ray, cardiomegaly with no  acute process.  EKG, normal sinus rhythm.  A 2D echocardiogram revealed  normal LV systolic function with moderate left ventricular hypertrophy  and moderate diastolic dysfunction, moderate to marked mitral annular  calcification.   HOSPITAL COURSE:  Patient was admitted to telemetry unit.  Myocardial  infarction was ruled out.  She underwent nuclear stress test that failed  to show any reversible ischemia and she was discharged home in  satisfactory condition with adjustment in her medications.  Her insulin  dose required some reduction due to tighter diet control in the  hospital.  Patient will be followed by me in 1 month.      [name], M.D.  Electronically Signed     ASK/MEDQ  D:  01/24/2009  T:  01/24/2009  Job:  [id number]

## 2011-05-09 NOTE — Assessment & Plan Note (Signed)
[facility]   NAMEMarland Kitchen  [name], [name]            ACCOUNT NO.:  [id number]   MEDICAL RECORD NO.:  [id number]      DATE OF BIRTH:  [birth year]   PHYSICIAN:  Jonelle Sports. Sevier, M.D.  VISIT DATE:  01/05/2008                                   OFFICE VISIT   HISTORY:  This 65 year old black female is seen for new wounds on both  lower extremities and bilaterally at the distal buttock creases.  She  has been a patient in this clinic before and was last discharged in  October at which time she had healing of the various stasis ulcerations  on her lower extremities.  She does maintain an extreme level of chronic  edema and has been less than faithful in using compressive wraps at home  and appears now with new ulcerations, one the right lower extremity, two  on the left, and two pressure areas on the distal buttock creases as  previously indicated.   Since last visit, she has had no change in medications except for the  addition of over-the-counter Claritin.  She has developed no new  allergies.  She has had no hospitalizations and no major illness.  She  is unaware of any new diagnoses.   PHYSICAL EXAMINATION:  VITAL SIGNS:  Blood pressure 162/75, pulse 76,  respirations 18, temperature 97.7.   Examination was limited to the distal lower extremities, both of which  are quite edematous.  On the right lower extremity laterally is a rather  beefy-appearing ulceration measuring 3.0 x for 4.0 x 0.1 cm with no  surrounding erythema.  There is some light fibrinopurulent slough in the  base.   On the medial left lower leg is a smaller ulceration measuring 0.6 x 2.3  x 0.1 cm, and on the left lateral lower leg is a large blister still  intact measuring approximately 12 x 7 cm.   In the distal buttock creases bilaterally are small ulcerations  measuring on the right 0.6 x 0.7 x 0.1 cm and on the left 2.0 x 0.6 x  0.1 cm.  These have fairly healthy-appearing bases.   IMPRESSION:  1. New stasis ulcerations of bilateral lower extremities.  2. New pressure ulcerations of distal gluteal creases bilaterally.   DISPOSITION:  1. The blister on the left lateral lower extremity is punctured, and      approximately 20 mL of clear, non-malodorous drainage issues forth.  2. The larger wound on the right lower extremity is cultured  3. Both extremities are then placed in Unna wraps.  4. The buttock lesions require no debridement per se, and they are      treated with applications of Iodosorb gel and covered by dry      dressing.  5. The patient is instructed to redress these gluteal crease wounds      daily following cleansing with mild soapy      water and adequate rinsing and patting dry, to redress daily with      Iodosorb gel for which she is given a prescription, then covered by      dry dressing pad.  6. Followup visit will be here in 1 week.           ______________________________  Jonelle Sports. Cheryll Cockayne, M.D.  RES/MEDQ  D:  01/05/2008  T:  01/05/2008  Job:  [id number]

## 2011-05-09 NOTE — Discharge Summary (Signed)
[place]  Patient:    [name], [name]                   MRN: [id number] Adm. Date:  [id number] Disc. Date: 06/05/01 Attending:  Nadara Mustard CC:         [name], M.D.   Discharge Summary  ADMISSION DIAGNOSES:  1. Right foot cellulitis.  2. Hypertension.  3. Type 2 diabetes mellitus.  4. Morbid obesity.  FINAL DIAGNOSES:  1. Status post cellulitis right leg.  2. Insulin-requiring diabetes mellitus.  3. Morbid obesity.  4. Hypertension.  DISCHARGE HOME MEDICATIONS:  1. Metoprolol 25 mg p.o. b.i.d.  2. Lisinopril 5 mg p.o. q.d.  3. Plendil 5 mg p.o. q.d.  4. Lasix 40 mg p.o. q.d.  5. Elavil 50 mg p.o. h.s.  6. Lipitor 10 mg p.o. q.d.  7. Darvocet-N 100 1 p.o. q.6h.  8. K-Dur 20 mEq p.o. q.d.  9. Glucotrol XL 5 mg, 2 tablets in the a.m., 1 in the p.m. 10. Humulin N 50 units in the a.m., 25 units in the p.m. 11. Zantac 150 mg p.o. b.i.d. 12. Tequin 400 mg 1 tablet daily for seven days.  ACTIVITY:  As tolerated.  DIET:  Low salt, low cholesterol, 1800 calorie ADA diet.  DISCHARGE INSTRUCTIONS:  Wet and dry dressing to the right leg.  Monitor blood sugar twice daily as before.  If notices swelling of the foot, redness, discharge, call Dr. Della Goo office immediately.  FOLLOW-UP:  Follow up with Dr. Algie Coffer next week.  CONDITION ON DISCHARGE:  Stable.  HISTORY OF PRESENT ILLNESS:  [name] is a 65 year old black female with recurrent diabetic feet infection over one to two years.  Has a three-day history of right foot and ankle redness.  Also complains of red foot and area redness.  Denies any fever, chills.  PAST MEDICAL HISTORY:  1. Diabetes for 15 years.  2. Hypertension for 31 years.  3. History of elevated cholesterol.  4. Morbid obesity.  5. No history of tobacco, drug, or alcohol abuse.  PAST SURGICAL HISTORY:  1. Tonsillectomy at age 64.  2. Appendectomy at age 35.  3. Polycystic ovarian surgery  surgery in 1973.  4. Gallbladder surgery in 1983.  5. Partial hysterectomy in 1985.  6. Cardiac catheterization in 1996.  7. Right foot incision and drainage in 1999, and in 2001.  HOME MEDICATIONS:  1. Plendil 5 mg p.o. q.d.  2. Zantac 150 mg p.o. b.i.d.  3. Lisinopril 5 mg p.o. q.d.  4. Lasix 40 mg p.o. q.d.  5. Elavil 50 mg p.o. h.s.  6. Lipitor 10 mg p.o. q.d.  7. Darvocet-N 100 1 tablet q.i.d.  8. K-Dur 20 mEq p.o. q.d.  9. Glucotrol XL 5 mg, 2 in the a.m., 1 in the p.m. 10. Metoprolol 25 mg p.o. b.i.d. 11. Humulin N 50 units in the a.m., 25 units in the p.m.  ALLERGIES:  PENICILLIN and ANCEF.  SOCIAL HISTORY:  She is widowed.  Has one son and one daughter.  She is on disability due to back problems.  FAMILY HISTORY:  Mother died of GI bleed.  Father died of multiple infections. Two brothers are diabetic, one brother in good health.  She has one sister who is diabetic.  PHYSICAL EXAMINATION:  GENERAL:  The patient is alert, awake, oriented x 3.  VITAL SIGNS:  Blood pressure 163/77, pulse 83, temperature 97.3.  HEENT:  Normocephalic, atraumatic.  Eyes PERRLA.  Extraocular movements intact.  Conjunctivae pink, sclerae nonicteric.  LUNGS:  Clear to auscultation.  CARDIOVASCULAR:  S1, S2 normal.  There was 2/6 systolic murmur.  ABDOMEN:  Soft and nontender.  EXTREMITIES:  There was no clubbing or cyanosis.  There was right ankle redness, warmth, and swelling.  Left lower leg on the lateral aspect had a small irregular ulcer with minimal discharge.  LABORATORY DATA:  Hemoglobin was 10, hematocrit 29.6, white count of 9.4. Sodium 139, potassium 4.1, chloride 105, bicarbonate 28, BUN 20, creatinine 1.0.  Blood cultures were negative.  Abscess culture from the right foot:  No wbcs or organisms seen.  No growth in two days.  EKG showed normal sinus rhythm.  No acute ischemic changes.  HOSPITAL COURSE:  The patient was admitted to the regular floor and was started on  IV vancomycin, and pan-cultures were obtained.  The patient remained afebrile during the hospital stay.  Vancomycin was discontinued on June 04, 2001, and was switched to p.o. Tequin.  The patient remained afebrile, with decrease in swelling and erythema.  The patient is eager to go home.  The patient will be discharged home on p.o. Tequin and will be followed up by Dr. Algie Coffer next week.  The patient has been advised to monitor blood sugar closely.  If she notices any swelling, redness, or discharge, she should report to Dr. Algie Coffer immediately. DD:  07/15/01 TD:  07/15/01 Job: [id number]

## 2011-05-09 NOTE — Assessment & Plan Note (Signed)
[facility]   NAMEMarland Kitchen  TRACI, GAFFORD            ACCOUNT NO.:  [id number]   MEDICAL RECORD NO.:  [id number]      DATE OF BIRTH:  [birth year]   PHYSICIAN:  Jake Shark A. Tanda Rockers, M.D. VISIT DATE:  08/12/2007                                   OFFICE VISIT   SUBJECTIVE:  Ms. Ferran is a 65 year old lady who we followed for  bilateral stasis ulcers.  In the interim, she is procured, her bilateral  below-the-knee 30-40 mm open-toed hose while continuing to wear the  compression wrap.  She denies interim drainage, malodor, pain or fever.   OBJECTIVE:  Her vital signs are stable.  She is afebrile.  Blood  pressure is 133/64, respirations 18, pulse 67, temperature 98.3.  Inspection of the lower extremities shows chronic changes of stasis with  persistence of 1-2+ edema, but adequate compression is manifested by  linear wrinkles in her skin.  There are no open ulcers.   ASSESSMENT:  Resolved stasis ulcers.   PLAN:  We have placed the patient in her compression hose, instructed  her in the use of the same, and she is discharged with instructions to  be followed up by primary care physician, Dr. Algie Coffer.      Harold A. Tanda Rockers, M.D.  Electronically Signed     HAN/MEDQ  D:  08/12/2007  T:  08/12/2007  Job:  [id number]   cc:   [name], M.D.

## 2011-05-09 NOTE — Assessment & Plan Note (Signed)
[facility]   NAMEMarland Kitchen  Erika, [name]            ACCOUNT NO.:  [id number]   MEDICAL RECORD NO.:  [id number]      DATE OF BIRTH:  1946-04-30   PHYSICIAN:  Theresia Majors. Tanda Rockers, M.D. VISIT DATE:  01/19/2008                                   OFFICE VISIT   SUBJECTIVE:  [name] is a 65 year old lady who we have followed  for bilateral fluid retention and stasis.  In the interim we have  treated her with bilateral compression wraps.  In the interim she has  been evaluated by orthopedist who has given her steroid injections of  both knees for chronic pain.  She has had no interim fever.  She  continues to be ambulatory.  She is also complaining of pain over the  lateral groins radiating into the pubis.  There has been no fever.   OBJECTIVE:  Blood pressure is 158/87, respirations 20, pulse rate 94,  temperature 98.3.  Capillary blood glucose is 109 mg percent.  Inspection of the lower extremity shows a persistence of 2 to 3+ edema.  The pedal pulses are readily palpable.  There is no evidence of  ischemia.  The wounds are as follows.  Wound #6 on the right lateral  lower extremity.  Wound #7 on the left medial leg.  Wound #8 on the left  lateral leg is a newly formed full-thickness blister with some subdermal  and subcutaneous ecchymosis.  Wound #9 on the right buttock was  completely scabbed with a healthy eschar and no drainage.  Wound #10 on  the left buttock has completely resolved.  Wound #8, the left lateral  lower extremity new blister underwent an excisional debridement  including skin, subcutaneous tissue, with the resulting bleeding  controlled with direct pressure.   ASSESSMENT:  Persistent stasis changes attendant with fluid retention.   PLAN:  We will return the patient to bilateral Unna wraps with a silver  matrix dressing over the ulcerative areas.  We have instructed the  patient to keep her legs elevated as much as possible, to continue her  ambulation, to continue her compliance with Dr. Roseanne Kaufman medical  orders.  We have given the patient opportunity to ask questions.  She  seems to understand indicates that she will be compliant.  We will  reevaluate her in 1 week p.r.n.      Harold A. Tanda Rockers, M.D.  Electronically Signed     HAN/MEDQ  D:  01/19/2008  T:  01/19/2008  Job:  [id number]

## 2011-05-09 NOTE — Assessment & Plan Note (Signed)
[facility]   NAMEMarland Kitchen  [name], [name]            ACCOUNT NO.:  [id number]   MEDICAL RECORD NO.:  [id number]      DATE OF BIRTH:  07/16/1946   PHYSICIAN:  Maxwell Caul, M.D. VISIT DATE:  01/28/2008                                   OFFICE VISIT   Ms. [name] is a 65 year old woman who we have been following for  bilateral venostasis wounds on her lower extremities.  Most recently she  has been receiving Unna boot wraps to these and there has been  improvement.  She has also had problems with pressure ulcers over her  ischial tuberosities bilaterally.   PHYSICAL EXAMINATION:  VITALS:  Temperature 98.3, pulse 81, respirations  18, blood pressure is 152/69.  GENERAL:  The areas over the bilateral ischial tuberosities have largely  healed and I have declared them resolved.  I have counseled her about  pressure relief measures and the patient expressed understanding.   She still has two open areas on her bilateral lower legs which are  venostasis ulcerations.  These were covered with a thick adherent white  eschar.  These underwent selective debridement's with #15 blade amyl for  anesthesia hemostasis with direct pressure.  She tolerated this well.  After this we applied prism hydrogel and Unna wraps.   IMPRESSION:  1. Pressure ulcerations bilateral ischial tuberosities are resolved.  2. Venostasis ulcerations. Underwent debridement's as noted above.  We      applied prism hydrogel and Unna wrap and we will see her again in a      week.           ______________________________  Maxwell Caul, M.D.     MGR/MEDQ  D:  01/28/2008  T:  01/29/2008  Job:  [id number]

## 2011-05-09 NOTE — H&P (Signed)
[name], [name]            ACCOUNT NO.:  [id number]   MEDICAL RECORD NO.:  [id number]          PATIENT TYPE:  INP   LOCATION:  0106                         FACILITY:  [facility]   PHYSICIAN:  [name], M.D.  DATE OF BIRTH:  11-19-1946   DATE OF ADMISSION:  08/27/2005  DATE OF DISCHARGE:                                HISTORY & PHYSICAL   CHIEF COMPLAINT:  Abdominal pain.   HISTORY OF PRESENT ILLNESS:  This 65 year old black female complains of mid  abdominal discomfort for the last 2 days, some nausea, no vomiting, no  fever, no chest pain, and no urinary symptoms.   PAST MEDICAL HISTORY:  Diabetes for 19 years, hypertension for 30 years. No  history of smoking or alcohol intake or drug use. Positive history of  elevated cholesterol level, obesity. No history of myocardial infarction, no  history of exercise daily, and no family history of premature coronary  artery disease.   PAST SURGICAL HISTORY:  1.  Tonsillectomy at age 58.  2.  Appendectomy at age 14.  3.  Polycystic ovary removal in 05-08-1972.  4.  Gallbladder surgery in 05/08/1982.  5.  Hysterectomy in 1985.  6.  Cardiac catheterization in 05-09-1995.  7.  Right foot incision and drainage in 08-May-1998 and 2000-05-08.   CURRENT MEDICATIONS:  1.  Lasix 40 mg daily.  2.  K-Dur 20 mEq one twice daily.  3.  Lopressor 50 mg one twice a day.  4.  Glucotrol XL 10 mg twice daily.  5.  Lisinopril 10 mg daily.  6.  Clonidine 0.1 mg twice daily.  7.  Amitriptyline 50 mg at bedtime.  8.  Synthroid 50 mcg daily.  9.  Zocor 40 mg daily.  10. Darvocet-N 100 one twice daily.  11. Humulin N 30 units subcutaneously in the morning.   ALLERGIES:  PENICILLIN and ANCEF.   PERSONAL HISTORY:  The patient is a widow, has one 48 year old son and one  86 year old daughter. She is disabled from back trouble. Husband died of  myocardial infarction in 05/09/03.   FAMILY HISTORY:  Mother died of GI bleed in May 08, 1998. Father died of some  infection in 05/08/01. The  patient has three brothers, two of whom have  diabetes. She has one sister who is alive and well.   REVIEW OF SYSTEMS:  The patient admits to chronic weight gain. Has vision  changes and wears reading glasses. No history of cataract surgery or hearing  loss, tinnitus, rhinorrhea, cough, hemoptysis, asthma, COPD, pneumonia, or  palpitations. Positive history of partial dentures, occasional chest pain,  recurrent leg edema, hiatal hernia, and joint pains. No history of  hepatitis, blood transfusion, kidney stones, stroke, seizures, or  psychiatric admissions.   IMMUNIZATION:  Is up-to-date with tetanus/diphtheria shot 6 years ago, flu  shot 10 months ago. No history of pneumonia shot.   PHYSICAL EXAMINATION:  VITAL SIGNS:  Temperature 98, pulse 70, respirations  16, blood pressure 110/50. Height 5 feet 2 inches, weight approximately 250  pounds.  GENERAL:  The patient is alert, oriented x3, with 97% saturation on room  air.  HEENT:  The patient is normocephalic, atraumatic. Eyes brown. Pupils  reacting to light. Extraocular movement intact. Conjunctivae pale pink. Ear,  nose, throat, mucous membranes pink.  NECK:  No JVD, no carotid bruit.  LUNGS:  Clear bilaterally.  HEART:  Normal S1, S2 with a 2/6 systolic murmur.  ABDOMEN:  Soft, distended, and epigastric area tenderness without guarding  or rigidity.  EXTREMITIES:  No clubbing or cyanosis, 1-2+ edema in lower legs.  CENTRAL NERVOUS SYSTEM:  Cranial nerves II-XII grossly intact. The patient  moves all four extremities.   LABORATORY DATA:  Elevated amylase, lipase.   CT of the abdomen suggestive of pancreatitis, otherwise unremarkable.   ASSESSMENT:  1.  Acute pancreatitis.  2.  Diabetes mellitus type 2.  3.  Hypertension.  4.  Obesity.   PLAN:  Give IV fluids and analgesics as needed.      [name], M.D.  Electronically Signed     ASK/MEDQ  D:  08/28/2005  T:  08/28/2005  Job:  [id number]

## 2011-05-09 NOTE — Assessment & Plan Note (Signed)
[facility]   NAMEMarland Kitchen  DOLLYE, GLASSER            ACCOUNT NO.:  [id number]   MEDICAL RECORD NO.:  [id number]      DATE OF BIRTH:  1946/09/18   PHYSICIAN:  Jake Shark A. Tanda Rockers, M.D.      VISIT DATE:                                   OFFICE VISIT   SUBJECTIVE:  Ms. Bonello is a 65 year old lady who we are following  for bilateral stasis ulcers.  In the interim we have treated her with  external compression.  She denies excessive drainage, excessive malodor,  fever or pain.  She continues on her diuretic therapy.   OBJECTIVE:  Her blood pressure is 186/77, respirations 18, pulse rate  74, temperature is 98.3.  Capillary blood glucose is 72 mg percent.  Inspection of the lower extremity shows that there has been reasonable  control of her edema.  The legs are not as engorged.  There is no local  tenderness.  There is no excessive drainage.  The wounds themselves  appear to be granulating with decrease in volume and advancing  epithelium from the periphery.  The wounds were photographed, measured  and cataloged.  Please refer to the data entries.  There is no evidence  of ascending infection or vascular insufficiency.   ASSESSMENT:  Improved stasis ulcers.   PLAN:  We will return the patient to bilateral Unna wraps.  We will  assign her to the nurses' clinic weekly for Unna wrap changes, and will  be evaluated monthly by the physician.      Harold A. Tanda Rockers, M.D.  Electronically Signed     HAN/MEDQ  D:  09/06/2007  T:  09/06/2007  Job:  [id number]

## 2011-05-09 NOTE — H&P (Signed)
[name], [name]            ACCOUNT NO.:  [id number]   MEDICAL RECORD NO.:  [id number]          PATIENT TYPE:  INP   LOCATION:  4735                         FACILITY:  [facility]   PHYSICIAN:  [name], M.D.  DATE OF BIRTH:  Jun 01, 1946   DATE OF ADMISSION:  02/13/2006  DATE OF DISCHARGE:                                HISTORY & PHYSICAL   CHIEF COMPLAINT:  Bilateral leg edema.   HISTORY OF PRESENT ILLNESS:  This 65 year old black female complains of  bilateral leg edema with a recent amputation of the toes.   PAST MEDICAL HISTORY:  1.  Diabetes for 20 years.  2.  Hypertension for 30+ years.  3.  No history of smoking, alcohol intake, or drug use.  4.  Positive history of elevated cholesterol level and obesity.  5.  No history of myocardial infarction, exercise, or family history of      premature coronary disease.   PAST SURGICAL HISTORY:  1.  Tonsillectomy at age 74.  2.  Appendectomy at age 19.  3.  Cholecystectomy and ovary removal in 05/27/72.  4.  Gallbladder surgery in May 27, 1982.  5.  Hysterectomy in 05/27/84.  6.  Cardiac catheterization in 05-28-1995.  7.  Right foot incision and drainage in 1998/05/27 and 05/27/2000.  8.  Right toe surgery in February 2007.   CURRENT MEDICATIONS:  1.  Lasix 80 mg twice daily.  2.  K-Dur 20 mEq twice daily.  3.  Lopressor 50 mg twice daily.  4.  Glucotrol XL 10 mg twice daily.  5.  Lisinopril 10 mg daily.  6.  Clonidine 0.1 mg twice daily.  7.  Amitriptyline 50 mg at bedtime.  8.  Synthroid 50 mcg daily.  9.  Zocor 40 mg daily.  10. Darvocet-N 100 one twice daily.  11. Humulin N 30 units subcutaneously in the morning.   ALLERGIES:  PENICILLIN and ANCEF.   PERSONAL HISTORY:  The patient is a widow, has a 24 year old son and 31-year-  old daughter, disabled from back trouble.  Husband died of myocardial  infarction in 05/28/03.   FAMILY HISTORY:  Mother died of GI bleed complications in 05/27/98.  Father died  of some infection in May 27, 2001.  The patient has  three brothers, two of whom have  diabetes and one sister who is alive and well.   REVIEW OF SYSTEMS:  The patient admits to chronic weight gain, vision  changes, wearing glasses.  No history of cataract surgery or hearing loss.  No tinnitus, rhinorrhea, cough, hemoptysis, asthma, COPD, pneumonia, or  palpitations.  Positive history of wearing partial dentures, occasional  chest pain, recurrent leg edema, hiatal hernia, and joint pain.  No history  of hepatitis, blood transfusions, kidney stones, strokes, seizures, or  psychiatric admissions.   IMMUNIZATIONS:  Up to date on tetanus, diphtheria shot 7 years ago.  Not up  to date on flu shot or pneumonia shot.   PHYSICAL EXAMINATION:  VITAL SIGNS: Temperature 98, pulse 72, respirations  18, blood pressure 120/60.  Height 5 feet 2 inches.  Weight approximately  250 pounds.  GENERAL: The patient  is alert and oriented x3 with a 97% saturation on room  air.  HEENT:  The patient is normocephalic and atraumatic.  She has brown eyes.  Pupils reactive to light.  Extraocular movements intact.  Conjunctivae pale  pink.  Ears, nose, throat grossly unremarkable.  NECK:  No JVD, no carotid bruits.  LUNGS:  Clear bilaterally.  HEART: Normal S1 and S2 with grade 2/6 systolic murmur.  ABDOMEN: Soft, distended, and nontender.  EXTREMITIES:  No clubbing or cyanosis, 2+ edema of both lower extremities  with a dressing applied over the amputated toe area.  CNS:  Cranial nerves grossly intact, and the patient moves all four  extremities.   LABORATORY DATA:  Pending.   ASSESSMENT:  1.  Bilateral leg edema with early cellulitis.  2.  Diabetes mellitus.  3.  Hypertension.  4.  Obesity.  5.  Possible congestive heart failure.   PLAN:  1.  Give IV Lasix.  2.  Blood cultures.  3.  IV antibiotics.  4.  Continue home medications.      [name], M.D.  Electronically Signed     ASK/MEDQ  D:  02/13/2006  T:  02/13/2006  Job:  [id number]

## 2011-05-09 NOTE — Assessment & Plan Note (Signed)
[facility]   NAMEMarland [name]  KIMBERLEY, SPEECE            ACCOUNT NO.:  [id number]   MEDICAL RECORD NO.:  [id number]      DATE OF BIRTH:  09/25/1946   PHYSICIAN:  Jonelle Sports. Sevier, M.D.  VISIT DATE:  01/12/2008                                   OFFICE VISIT   HISTORY:  This 65 year old black female is followed for several stasis  ulcerations of the lower extremities and also to pressure ulcerations of  the inferior gluteal crease bilaterally.   When she was seen a week ago the intent was to have her treat the  gluteal wounds with a daily application of Iodosorb, when she obtained  this she noticed that it was considered contraindicating the face of  thyroid disease.  (She does have hypothyroidism which is compensated  with Synthroid therapy).   Her extremities have been in Unna wraps.   The culture obtained from her right lower extremity wound last week  showed a few Staphylococcus aureus not methicillin-resistant.  These  were felt to be present only in contamination stage.   Since last seen the patient has had no drainage, no odor.  No fever,  chills or systemic symptoms and no significant pain.   EXAMINATION:  Blood pressure 103 pressure 143/94, pulse 77, respirations  18, temperature 97.8.  Self obtained a blood glucose 77 this morning.  The wounds on the buttock creases are nearly healed but both are covered  with some encrustation.  This is removed bilaterally to those 2 wounds  by selective sharp debridement without any bleeding.  The wounds are  then treated with application of Iodosorb and covered with a dry  dressing.   The patient is reassured Iodosorb remains appropriate for management of  these lesions despite her thyroid disease and she is protected by her  Synthroid and by the very small surface area of the wounds.  She  accordingly agrees to continue daily cleansing with application of  Iodosorb at home until these wounds heal.   The leg wounds  require no significant debridement today and all are  found to be epithelialized.  There is still some considerable edema in  both extremities and some degree of erythema that is stasis dermatitis  so both these extremities will be returned again today to Unna wraps  with no direct application of Unna wrap being made to the wounds  themselves.   Follow-up visit will be here in 1 week.           ______________________________  Jonelle Sports. Cheryll Cockayne, M.D.     RES/MEDQ  D:  01/12/2008  T:  01/12/2008  Job:  [id number]

## 2011-05-09 NOTE — Discharge Summary (Signed)
[name], [name]            ACCOUNT NO.:  [id number]   MEDICAL RECORD NO.:  [id number]          PATIENT TYPE:  INP   LOCATION:  1437                         [facility]ACILITY:  [facility]   PHYSICIAN:  [name], M.D.  DATE OF BIRTH:  [birth year]   DATE OF ADMISSION:  03/23/2008  DATE OF DISCHARGE:  04/05/2008                               DISCHARGE SUMMARY   FINAL DIAGNOSES:  1. Right lung pneumonia.  2. Hypertension.  3. Diabetes mellitus.  4. Chronic renal insufficiency.  5. Obesity.  6. Chronic gastrointestinal bleed.  7. Iron deficiency anemia.   DISCHARGE MEDICATIONS:  1. Lasix 80 mg one twice daily.  2. Lisinopril 20 mg one daily.  3. Bidil one-half tablet three times daily.  4. Zocor 20 mg daily.  5. Kay Ciel 20 mEq daily.  6. Amitriptyline 50 mg at bedtime.  7. Nexium 40 mg daily.  8. Humulin-N 20 units subcutaneously in the morning and 20 units in      the evening.  9. Ferrous sulfate 325 mg one twice daily.  10.Clonidine 0.1 mg one twice daily.  11.Colace 100 mg one twice daily.  12.Metoclopramide 5 mg four times daily.  13.Ventolin metered dose inhaler two puffs four times daily as needed.  14.Levothyroxine 50 mcg one daily.  15.Metoprolol 50 mg one-half twice daily.  16.Darvocet-N 100 one twice daily as needed.   The patient is to discontinue aspirin.   DIET:  Discharge diet is low sodium, heart-healthy diet.   WOUND CARE:  The patient is to follow in the wound care clinic at [facility]   ACTIVITY:  The patient is to increase activity slowly.   FOLLOWUP:  Followup with Dr. Orpah Cobb in one week.  The patient is  to call [phone] for an appointment.   HISTORY:  This is a 65 year old black female presented with a one day  history of shortness of breath on lying down.  She had a recent  admission to the hospital for similar conditions.   PHYSICAL EXAMINATION:  VITAL SIGNS:  Temperature 98, pulse 90,  respiratory rate 20,  blood pressure 170/60.  GENERAL:  The patient is alert and oriented x3 in no acute distress.  HEENT:  Patient is normocephalic, atraumatic. Has brown eyes which are  reactive to light.  Extraocular movements intact. Conjunctivae are pink,  sclerae nonicteric.  Ears, nose and throat unremarkable with midline  tongue.  NECK:  No JVD.  No bruit.  LUNGS:  Decreased air entry in both bases with wheezes and crackles.  HEART:  Normal S1 and S2 with grade 4/6 systolic murmur in the left  sternal border.  ABDOMEN:  Soft, distended but nontender.  EXTREMITIES:  No clubbing or cyanosis.  1 to 2+ edema extending up to  the knees.  Heavy dressing applied to both lower extremities with  evidence of right toe amputation.  NEUROLOGICAL:  Cranial nerves are grossly intact.  Patient moves all  four extremities and she is right handed.   LABORATORY DATA:  Hemoglobin 9.4, hematocrit 27.8, subsequently  hemoglobin after transfusion was 10.3, platelet count  normal.  INR 1.0.  Electrolytes normal.  Sugar elevated at 384.  Creatinine 1.4.  Iron  level was 27.  Saturation was 14%.  Capsule endoscopy - pending results.   HOSPITAL COURSE:  The patient was admitted to telemetry unit. She  received IV Lasix with a good response.  She had anemia that required GI  consultation.  Previous recent work up was unremarkable and hence the  patient underwent capsule endoscopy.  Her overall condition stabilized  post blood transfusions and she was discharged home in satisfactory  condition with followup by me in one month and by GI doctor in 1-2  weeks.      [name], M.D.  Electronically Signed     ASK/MEDQ  D:  05/09/2008  T:  05/09/2008  Job:  [id number]

## 2011-05-09 NOTE — Discharge Summary (Signed)
[name], [name]            ACCOUNT NO.:  [id number]   MEDICAL RECORD NO.:  [id number]          PATIENT TYPE:  INP   LOCATION:  1604                         FACILITY:  [facility]   PHYSICIAN:  [name], M.D.  DATE OF BIRTH:  [birth year]   DATE OF ADMISSION:  08/27/2005  DATE OF DISCHARGE:  09/01/2005                                 DISCHARGE SUMMARY   PRINCIPAL DIAGNOSES:  1.  Acute pancreatitis.  2.  Gastroesophageal reflux disease.  3.  Diabetes mellitus type 2.  4.  Hypertension.  5.  Obesity.   DISCHARGE MEDICATIONS:  1.  Multivitamins 1 daily.  2.  Glucotrol XL 5 mg 1 daily in the evening and 10 mg 1 daily in the      morning.  3.  Amitriptyline 50 mg at bedtime.  4.  Claritin 10 mg 1 daily.  5.  Humulin N 35 units in the morning and 30 units in the evening      subcutaneously.  6.  Darvocet-N 100 1 twice daily as needed.  7.  AcipHex 20 mg 1 daily.  8.  Niacin 250 mg 2 at bedtime.  9.  Aspirin 81 mg at bedtime.   DISCHARGE DIET:  Low fat, low salt diet.  Diabetic diet.   DISCHARGE ACTIVITY:  As tolerated.   FOLLOW UP:  1.  By Dr. Orpah Cobb in two weeks.  2.  By GI doctor in 6-8 weeks.   CONDITION ON DISCHARGE:  Improved.   HISTORY:  This 65 year old black female presented with abdominal discomfort  along with nausea without vomiting, fever.  No chest pain.  No urinary  symptoms.  The patient had markedly elevated lipase in the 500 unit range.  She has a past medical history of diabetes for 19 years, hypertension for  over 30 years.  No history of smoking, alcohol intake, but has a history of  elevated cholesterol level and obesity.   PHYSICAL EXAMINATION:  VITAL SIGNS:  Temperature 98, pulse 70, respirations  16, blood pressure 110/50.  Height 5 feet 2 inches.  Weight approximately  250 pounds.  HEENT:  Patient is normocephalic and atraumatic with brown eyes.  Pupils are  equal and reactive to light.  Extraocular movements are intact.  Conjunctivae pale and pink.  Ears, nose, and throat:  Mucous membranes pink.  NECK:  No JVD.  No carotid bruits.  LUNGS:  Clear bilaterally.  HEART:  Normal S1 and S2 with grade 2/6 systolic murmur.  ABDOMEN:  Soft.  Distended.  Epigastric and umbilical area tenderness  without guarding or rigidity.  EXTREMITIES:  No clubbing or cyanosis.  Edema 1-2+ in the lower extremities.  CENTRAL NERVOUS SYSTEM:  Cranial nerves II-XII are grossly intact.  Patient  moves all four extremities.   LABORATORY DATA:  Slightly low hemoglobin of 11.2, hematocrit 32.8.  Normal  to near-normal WBC count.  Near-normal platelet count.  ESR elevated at 93.  Glucose elevated at 410.  Otherwise, electrolytes were normal.  BUN and  creatinine borderline at 22 and 1.3.  Subsequent BUN and creatinine was  normal.  Sugar was down  to 240.  Sugar was down to 150 mg/dl range today.  Liver enzymes were normal.  Lipase initially 579.  Subsequent lipase was 60.  Lipase today is in the range of 30s.  Liver profile showed a cholesterol of  136, triglycerides 150.  HDL cholesterol was low at 33.  LDL cholesterol was  normal at 73.  Urinalysis was unremarkable except for 1000 mg of sugar.   The patient's EKG showed a sinus rhythm.   CT of the abdomen and pelvis showed acute pancreatitis with a mild  pericardial effusion and mitral calcification.  No acute pelvic finding.   Ultrasound of the heart revealed mild pericardial effusion with preserved  left systolic function.   HOSPITAL COURSE:  The patient was admitted to the regular floor.  She was  started on IV fluids and bowel rest.  Her condition improved in the next 48  hours.  GI consult was obtained.  Her blood sugars were out of control;  hence, her insulin doses were gradually increased.  Today she had a  reasonable sugar control, and she had decreased abdominal discomfort.  She  was able to tolerate her feedings.  Niacin was started for her low HDL, and  AcipHex was  started for gastroesophageal reflux disease.  This was per GI  consult suggestion of Dr. Norval Gable group.  The patient was then  discharged in satisfactory condition with followup by me in 1-2 weeks.      [name], M.D.  Electronically Signed     ASK/MEDQ  D:  09/01/2005  T:  09/01/2005  Job:  [id number]

## 2011-05-09 NOTE — Discharge Summary (Signed)
[name], [name]            ACCOUNT NO.:  [id number]   MEDICAL RECORD NO.:  [id number]          PATIENT TYPE:  INP   LOCATION:  4734                         FACILITY:  [facility]   PHYSICIAN:  [name], M.D.  DATE OF BIRTH:  Apr 21, 1946   DATE OF ADMISSION:  02/13/2006  DATE OF DISCHARGE:  02/25/2006                                 DISCHARGE SUMMARY   PRINCIPAL DIAGNOSES:  1.  Diabetic foot ulcer.  2.  Congestive heart failure.  3.  Cellulitis of the foot.  4.  Hypertension.  5.  Obesity.  6.  Peripheral vascular disease.   DISCHARGE MEDICATIONS:  1.  Lisinopril 20 mg one daily.  2.  AcipHex 40 mg daily.  3.  Elavil 50 mg at bedtime.  4.  Clonidine 0.2 mg b.i.d.  5.  Metoprolol 50 mg b.i.d.  6.  Synthroid 50 mcg daily.  7.  Glipizide XL 10 mg b.i.d.  8.  Zocor 20 mg at bedtime.  9.  Potassium chloride 20 mEq one and a half tablet b.i.d. (30 mEq b.i.d.).  10. Claritin 10 mg daily.  11. Lasix 40 mg b.i.d.  12. Septra DS one b.i.d. for one week.  13. Humulin N 35 units in the morning and 15 units in the evening.   SPECIAL INSTRUCTIONS:  The patient is to follow instructions for dressing  change.   DIET:  Low-fat, low-salt diet.   ACTIVITY:  The patient is to increase activity slowly, to use crutches for  walking.   FOLLOWUP:  She is to follow up with Dr. Zachery Dakins in one week and by Dr.  Algie Coffer in two weeks.   HISTORY:  This 65 year old black female had bilateral leg edema following  recent amputation of the toes. The patient has a longstanding history of  diabetes, hypertension, obesity, and elevated cholesterol.   PHYSICAL EXAMINATION:  VITAL SIGNS: Temperature 98, pulse 72, respirations  18, blood pressure 120/60, height 5 feet 2 inches, weight 250 pounds.  GENERAL: The patient is alert and oriented times three and had a 97%  saturation of oxygen on room air.  HEENT: The patient is normocephalic and atraumatic. She has brown eyes.  Pupils reactive to  light. Extraocular movements intact. Conjunctiva pale,  pink. Oropharynx clear and unremarkable.  NECK: No JVD, no carotid bruits.  LUNGS: Clear bilaterally.  HEART: S1 and S2. Grade 2/6 systolic murmur.  ABDOMEN: Soft, distended, and nontender.  EXTREMITIES: No clubbing or cyanosis. 2+ edema left lower extremity with  redness over lateral aspect of lower leg. 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  CNS: Cranial nerves grossly intact. The patient moves all four extremities.   LABORATORY DATA:  Hemoglobin 8.7, hematocrit 25.9, WBC count 7200 and  subsequent WBC count normal at 8200.  Platelet 293,000 and showed mild  elevation on January 26, 2006, to 496,000. PTI was unremarkable.  Electrolytes were unremarkable including glucose level of 92. Creatinine  1.2. Albumin was low at 2.3. Liver enzymes were normal. CK-MB normal. Blood  cultures showed no growth for five days.   Chest x-ray  revealed no acute disease.   Lower extremity arterial evaluation showed ABI and waveforms within normal  limits in the right extremity and on the left ABI not ascertained secondary  to edema and tenderness.   HOSPITAL COURSE:  The patient was admitted to 4700 unit. She was started on  IV antibiotics of vancomycin. She had dressing change b.i.d. along with 1%  Silvadene cream per Dr. Zachery Dakins of [facility]. She had IV  Lasix for lower extremity edema. Her home medications were restarted. The  patient's condition remained stable. The patient made very slow progress in  regards to the left lower extremity cellulitis. For necrotic issues,  Accuzyme was used and hydrotherapy was also used. The patient's sugar levels  were adjusted with increasing doses of Lantus insulin up to 40 units  subcutaneously.  On February 24, 2006, vancomycin was finally continued after two weeks of  therapy and the Cipro and Flagyl were also discontinued. She was started on   Septra DS one b.i.d. She was discharged home in satisfactory condition with  home health nurse and wound care support at home.      [name], M.D.  Electronically Signed     ASK/MEDQ  D:  04/07/2006  T:  04/08/2006  Job:  [id number]

## 2011-05-09 NOTE — Assessment & Plan Note (Signed)
[facility]   NAMEMarland Kitchen  BRINDLE, LEYBA            ACCOUNT NO.:  [id number]   MEDICAL RECORD NO.:  [id number]      DATE OF BIRTH:  1946/11/26   PHYSICIAN:  Jake Shark A. Tanda Rockers, M.D. VISIT DATE:  08/30/2007                                   OFFICE VISIT   SUBJECTIVE:  Ms. Choe is a 65 year old lady with bilateral stasis  ulcers who was last seen in the wound center on August 12, 2007 with  complete resolution of her stasis.  She was instructed in the use of  bilateral below-the-knee compression hose.  She has not been seen in  follow-up by her cardiologist, Dr. Algie Coffer.  She has continued to have  significant edema.  She noticed a breakdown of ulcerations 2 weeks ago  and discontinued using her stockings. In the interim these ulcers have  become larger.  There has been no interim fever.  She has had some  moderate increase in dyspnea on exertion but has had no chest pain.  There has been no fever.   OBJECTIVE:  Blood pressure is 159/97, respirations 18, pulse rate 84,  temperature 98.6.  Capillary blood glucose is 73 mg percent.  Inspection  of the lower extremity shows that there is bilateral 3+ edema.  The  ulcerations have reoccurred in similar areas as before.  There is no  evidence of a ascending infection, although the hyperemia associated  with stasis is present.  The legs are taut and mildly tender.  There is  no popliteal tenderness. The pedal pulses are indeterminate. Her last  ABI was 0.8 and  0.9 in the left and right respectively.  The capillary  refill is brisk and there is no evidence of acute ischemia.   ASSESSMENT:  Recurrent bilateral stasis most likely related to  compensated congestive failure or renal insufficiency.   PLAN:  We will reinitiate compression therapy conjointly with a follow  up by Dr. Algie Coffer to assess the renal and cardiac status.   We have explained the relationship between fluid retention, the  possibility of heart or  kidney insufficiency to the patient in terms  that she seems to understand.  We have impressed upon her the importance  of keeping her follow-up with Dr. Algie Coffer.  She has indicated that she  will be compliant.  We will reevaluate her in 1 week.  We have in  addition taken pain to explain to her the possibility of the wraps  becoming too tight and causing injury. We have specifically advised her  to keep her legs elevated as much as possible if the wraps become too  tight in spite of elevation, she is to remove the wraps completely and  to call the clinic for follow-up appointment.  Otherwise we will see her  in 1 week.      Harold A. Tanda Rockers, M.D.  Electronically Signed     HAN/MEDQ  D:  08/30/2007  T:  08/30/2007  Job:  [id number]

## 2011-05-09 NOTE — Discharge Summary (Signed)
[name], [name]            ACCOUNT NO.:  [id number]   MEDICAL RECORD NO.:  [id number]          PATIENT TYPE:  INP   LOCATION:  5010                         FACILITY:  [facility]   PHYSICIAN:  [name], M.D.  DATE OF BIRTH:  [birth year]   DATE OF ADMISSION:  03/04/2005  DATE OF DISCHARGE:  03/10/2005                                 DISCHARGE SUMMARY   PRINCIPAL DIAGNOSES:  1.  Bilateral lower extremity cellulitis.  2.  Hypertension.  3.  Diabetes mellitus type 2.  4.  Iron deficiency anemia.  5.  Obesity.   DISCHARGE MEDICATIONS:  1.  Lopressor 50 mg one twice daily.  2.  Lisinopril 10 mg one daily.  3.  Lasix 40 mg one daily.  4.  NPH insulin 20 units subcutaneously in the morning, to increase the dose      by 5 units subcutaneously up to 40 units in the morning if blood sugars      are over 150 mg/dL.  5.  Glucotrol XL 10 mg daily.  6.  Zocor 40 mg one at bedtime.  7.  Synthroid 50 mcg one daily.  8.  Clonidine 0.1 mg one twice daily.  9.  K-Dur 10 mEq two twice daily.  10. Amitriptyline 50 mg at bedtime.  11. Cipro 500 mg one b.i.d. x 10 days.  12. Colace 100 mg twice daily.  13. Darvocet-N 100 one 4 times daily as needed.   PAIN MANAGEMENT:  Darvocet as directed.   ACTIVITY:  As tolerated.   DIET:  Lowfat, low salt, 1,400 calorie diabetic diet.   WOUND CARE INSTRUCTIONS:  Patient to see [facility] in one to  two weeks.   SPECIAL INSTRUCTIONS:  Patient to walk as much as possible.   FOLLOWUP:  By Dr. Orpah Cobb in one week.   CONDITION ON DISCHARGE:  Improved.   HISTORY:  This 65 year old black female with a past history of angina,  hypertension, diabetes, hypercholesterolemia, hypothyroidism, morbid  obesity.  Had recent cellulitis of the legs.  Complaining of progressive  swelling of the legs, with redness for the last few days.   PHYSICAL EXAMINATION:  GENERAL:  The patient was alert, oriented x 3, in no  acute distress.  VITAL  SIGNS:  Blood pressure 144/60; pulse 68, atrial fibrillation.  HEENT:  Conjunctivae pink.  NECK:  Supple.  No JVD, no thyromegaly.  LUNGS:  Clear to auscultation.  CARDIOVASCULAR:  Normal S1, S2.  2/6 systolic murmur.  ABDOMEN:  Soft.  Bowel sounds present.  Obese.  EXTREMITIES:  Edema 2+.  Erythema left lateral aspect of the lower  extremities, with pregangrenous changes in the second right toe.  Erythema  of the right great toe.   LABORATORY DATA:  Hemoglobin 10.3, hematocrit 30.5, normal WBC count,  platelet count 424.  Subsequent platelets came down to 383.  Subsequent  hemoglobin was down to 9.2, and hematocrit of 26.5.  Reticulocyte count was  normal.  PT, INR, and PTT were normal.  Electrolytes were normal.  BUN  elevated at 28, creatinine 1.6.  Subsequent BUN and creatinine were normal.  Sugar elevated at 245.  Liver enzymes were normal.  Alkaline phosphatase  likely high at 151.  TSH was normal at 1.4.  Iron level was low at 29, with  a 14% saturation.  Vancomycin trough level was less than 5.  Blood cultures  showed no growth for five days.   Abdominal x-ray unremarkable.   EKG:  Normal sinus rhythm.   Lower extremity venous evaluation:  No DVT, SVT, or Baker's cyst  bilaterally.   HOSPITAL COURSE:  The patient was admitted to a regular floor.  Blood  cultures were obtained.  Home medications were given.  Vancomycin IV per  pharmacy and Cipro IV per pharmacy was noted.  The patient's condition  remarkably improved in first 72 hours along with the use of IV Lasix 40 mg x  1.  Other home medications were restarted.  Potassium dose was increased,  and her hypoglycemic agents were also restarted.  Overall, her condition was  stable enough for PT/OT consult, and with the patient ambulating well she  was discharged home on March 10, 2005 in satisfactory condition, with  followup by me in two weeks.      ASK/MEDQ  D:  04/17/2005  T:  04/18/2005  Job:  [id number]

## 2011-05-09 NOTE — Assessment & Plan Note (Signed)
[facility]   NAMEMarland Kitchen  [name], [name]            ACCOUNT NO.:  [id number]   MEDICAL RECORD NO.:  [id number]      DATE OF BIRTH:  July 29, 1946   PHYSICIAN:  Jake Shark A. Tanda Rockers, M.D. VISIT DATE:  07/12/2007                                   OFFICE VISIT   SUBJECTIVE:  [name] is a 65 year old lady who we have followed  for bilateral stasis ulcers.  During the last visit, we treated her with  bilateral Unna wraps.  She had a total of 3 wounds, numbered 1, 2 and 3.  In the interim, she continues to ambulate.  There has been no fever.  Her pain has been well tolerated.   OBJECTIVE:  VITAL SIGNS:  Blood pressure is 149/88, respirations 20,  pulse rate 77, temperature 98.7.  Capillary blood glucose is 63 mg  percent.  EXTREMITIES:  Inspection of the lower extremity shows that the ulcers  have decreased in volume.  The patient still has significant edema.  On  the left lower extremity on the medial aspect, there is a new blister  with a milky consistency.  This was unroofed sharply and cultured.  The  capillary refill is normal.  There is no evidence of ischemia.  There is  no evidence of ascending cellulitis.  There is no particular pain on  palpation in either groin nor in the popliteal fossa.   IMPRESSION:  Bilateral stasis ulcerations.   PLAN:  We will continue compression therapy with.  We will reevaluate  the patient in 1 week.      Harold A. Tanda Rockers, M.D.  Electronically Signed     HAN/MEDQ  D:  07/12/2007  T:  07/13/2007  Job:  [id number]

## 2011-05-09 NOTE — Assessment & Plan Note (Signed)
[facility]   NAMEMarland Kitchen  ALDEAN, SUDDETH            ACCOUNT NO.:  [id number]   MEDICAL RECORD NO.:  [id number]      DATE OF BIRTH:  1945/12/24   PHYSICIAN:  Jake Shark A. Tanda Rockers, M.D. VISIT DATE:  07/05/2007                                   OFFICE VISIT   SUBJECTIVE:  Ms. Riggins is a 65 year old female referred by Dr. Orpah Cobb for evaluation of bilateral lower extremity ulcerations.   IMPRESSION:  Bilateral stasis ulcers with fluid retention.   RECOMMENDATION:  Proceed with an Unna boot protocol with continued  monitoring and medical management of her cardiovascular diseases per Dr.  Algie Coffer.   HISTORY OF PRESENT ILLNESS:  Ms. Bowie is a 65 year old lady who has  a long history of cardiovascular disease.  She has been under Dr.  Roseanne Kaufman care for several years.  She most recently has developed  progressive swelling in her lower extremities associated with watery  blisters and rupture.  She has had episodes of fever, but there has been  no malodor.  There has been no recognizable trauma.   CURRENT MEDICATIONS:  1. Aspirin 325 mg per day.  2. Nexium 40 mg a day.  3. Synthroid 50 mcg daily.  4. Lasix 80 mg b.i.d..  5. Potassium chloride 20 mEq t.i.d.  6. Naprosyn 500 mg b.i.d.  7. Glucotrol XL 10 mg b.i.d.  8. Metoprolol 50 mg b.i.d.  9. Clonidine 0.1 mg b.i.d.  10.Simvastatin 40 mg daily.  11.Darvocet-N 100 b.i.d.  12.Lisinopril 20 mg daily.  13.Humulin N 35 units in the morning and 15 units in the evening.  14.Amitriptyline 50 mg nightly.   ALLERGIES:  PENICILLIN.   PREVIOUS SURGERIES:  1. 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  2. Hysterectomy in 1985.  3. Tonsillectomy in 1961.  4. She also had an open reduction/internal fixation of the right foot      in 1999.   She has had diabetes for 22 years.   FAMILY HISTORY:  Her family history is positive for diabetes,  hypertension, stroke and  heart attack.   SOCIAL HISTORY:  She is a widow.  She has three children.  She lives  with her daughter and son-in-law.  She is disabled due to her diabetes  and heart conditions.   REVIEW OF SYSTEMS:  On review of systems, she has extreme exercise  intolerance.  She is unable to walk four blocks.  She does not regularly  negotiate stairs.  There has been no hemoptysis.  She has had some  episodes of slurred speech, but no unilateral paralysis.  She has  occasional shortness of breath, has paroxysmal nocturnal dyspnea.  Has  polyuria associated with her diuretic therapy.  Her weight has  fluctuated due to her fluid status.  The remainder of her review of  systems is negative.  She does admit to angina occasionally.   PHYSICAL EXAMINATION:  GENERAL:  She is an alert and oriented female in  no acute distress.  VITAL SIGNS:  Blood pressure is 141/66, respirations are 14, pulse rate  of 84, temperature 98.2.  Capillary blood glucose was 57 mg percent this  morning.  HEENT:  Inspection of the HEENT exam is clear.  NECK:  Supple.  Carotid bruits are not appreciated.  Trachea is midline.  Thyroid is nonpalpable.  LUNGS:  Clear.  HEART:  Heart sounds are distant.  ABDOMEN:  Soft.  EXTREMITIES:  Extremity exam is remarkable for bilateral 3+ edema with  ruptured bullae on both lower extremities in the gaiter area.  The  bullae were debrided free with minimum hemorrhage control with direct  pressure.  The pedal pulses are indeterminate owing to the presence of  the edema, but there is no evidence of petechiae, cellulitis or abscess.  An ABI has been ordered during this visit.  Neurologically, the patient  is insensate to the [facility] filament.  The wounds of both extremities  were measured, photographed and cataloged.  Please refer to the wound  expert for details.   DISCUSSION:  This 65 year old lady with a longstanding history of  diabetes and severe cardiovascular compromise has severe  fluid retention  with secondary stasis and blister formation.  We will initiate her  therapy with compression wrap and a Silverlon swathe as an antibacterial  agent.  We will reevaluate her in one week.   We have discussed the use of external compression in the management of  stasis ulcers in terms that the patient seems to appreciate.  We have  given her an opportunity to ask questions; she seems to understand,  indicates that she will be compliant with the instructions as given.  We  will reevaluate her in one week.      Harold A. Tanda Rockers, M.D.  Electronically Signed     HAN/MEDQ  D:  07/05/2007  T:  07/05/2007  Job:  [id number]   cc:   [name], M.D.

## 2011-05-09 NOTE — Consult Note (Signed)
[facility]  Patient:    [name], [name]                   MRN: [id number] Proc. Date: 08/04/00 Adm. Date:  [id number] Attending:  Nadara Mustard                          Consultation Report  DIABETIC FOOT CLINIC CONSULTATION  HISTORY OF PRESENT ILLNESS:  [name] is a 65 year old woman who stepped on a tack on June 14th, approximately two months ago.  She states the tack stayed in her foot for about a day.  She initially underwent an irrigation and debridement by Dr. Luisa Hart L. Ballen at bedside and states she was hospitalized for approximately eight days and was on a PICC line and vancomycin.  She states she was discharged from the hospital on Cipro and was on Cipro for four weeks.  She states the radiographs obtained at the hospital were negative for osteomyelitis.  She states she has also stepped on previous thumb tacks and a needle in her right foot.  PAST MEDICAL HISTORY:  Significant medical conditions include type 2 diabetes, hypertension, cardiac valvular problems, cataracts, herniated disk and hernia.  PAST SURGICAL HISTORY:  Significant surgeries include laser surgery to both eyes, right foot surgery in 1996 secondary to stepping on a needle, tonsillectomy, appendectomy, polycystic ovarian cyst and hysterectomy.  ALLERGIES:  PENICILLIN causes a rash.  MEDICATIONS:  Insulin, Glucotrol, Darvocet-N, Lasix, K-Dur, Lopressor, Plendil, aspirin, amitriptyline and Lipitor.  PHYSICAL EXAMINATION  EXTREMITIES:  She does have calluses on the plantar aspects of both feet.  She does have palpable dorsalis pedis pulses.  She does have redness, decreased hair growth, edema and skin pigment changes in both lower extremities. Doppler reports, per the patients report:  She does have palpable pulses. She has purulent drainage from the plantar ulcer.  The ulcer does probe to bone.  She has a sausage digit of the left great toe.  ASSESSMENT:   Wagner grade 3 ulcer, left great toe, with osteomyelitis.  PLAN:  We will go ahead and trim her nails today.  We will plan for a left great toe amputation and plan to follow up at my office for surgical intervention.  Continue with current wound care. DD:  08/18/00 TD:  08/18/00 Job: [id number]

## 2011-05-09 NOTE — Consult Note (Signed)
[name], [name]                      ACCOUNT NO.:  [id number]   MEDICAL RECORD NO.:  [id number]                   PATIENT TYPE:  INP   LOCATION:  3727                                 FACILITY:  [facility]   PHYSICIAN:  Ollen Gross. Vernell Morgans, M.D.              DATE OF BIRTH:  [birth year]   DATE OF CONSULTATION:  04/03/2004  DATE OF DISCHARGE:                                   CONSULTATION   REFERRING PHYSICIAN:  Dr. [name].   REASON FOR CONSULTATION:  Ms. [name] is a 65 year old black female with  some significant medical problems including diabetes, hypertension and  morbid obesity, who presents with a 1-week history of bilateral lower  extremity swelling and redness.  She denied any fevers.  She does not  currently have any pain.  She says she has had tremendous improvement since  she was admitted to the hospital yesterday.  She tries to elevate her legs  at home but has difficulty doing this.  She otherwise denies fevers, chills,  chest pain, shortness of breath, diarrhea or dysuria.  The rest of her  review of systems is unremarkable.   PAST MEDICAL HISTORY:  Past medical history is significant for:  1. Diabetes.  2. Hypertension.  3. Hypercholesterolemia.  4. Morbid obesity.  5. Polycystic ovaries.   PAST SURGICAL HISTORY:  Past surgical history is significant for:  1. Tonsillectomy.  2. Appendectomy.  3. Oophorectomy for polycystic ovaries.  4. Gallbladder surgery.  5. Partial hysterectomy.  6. Cardiac catheterization.  7. Right foot I&D.   MEDICATIONS:  Medications include K-Dur, Lasix, Lopressor, Glucotrol,  clonidine, naproxen, Aciphex, amitriptyline and Synthroid.   ALLERGIES:  Allergies are to PENICILLIN and ANCEF.   SOCIAL HISTORY:  She denies the use of alcohol or tobacco products.   FAMILY HISTORY:  Family history is significant for GI bleed in her mother,  diabetes in a brother.   PHYSICAL EXAM:  VITAL SIGNS:  On physical exam, she is afebrile  with stable  vitals.  GENERAL:  In general, she is a morbidly obese black female lying in bed in  no acute distress.  SKIN:  Her skin is warm and dry with no jaundice.  EYES:  Her extraocular muscles are intact.  Pupils are equal, round and  reactive to light.  Sclerae nonicteric.  LUNGS:  Lungs are clear bilaterally with no use of accessory respiratory  muscles.  HEART:  Heart has a regular rate and rhythm with an impulse in the left  chest.  ABDOMEN:  Abdomen is soft and nontender with no palpable mass or  hepatosplenomegaly, although she is morbidly obese.  EXTREMITIES:  She has some very slight redness over her lower extremities,  down near the ankle and mid-shin area, but this is very subtle.  She is  morbidly obese but has no pitting edema and no cyanosis.  She has good  palpable dorsalis pedis pulses bilaterally.  She has no ulcerations or skin  necrosis.  PSYCHOLOGIC:  She is alert and oriented x3 with no evidence of anxiety or  depression.   ASSESSMENT AND PLAN:  This is a 65 year old black female with multiple  medical problems including diabetes, hypertension and morbid obesity, with  some resolving cellulitis on her lower extremities.  She does not have any  obvious abscess and she is currently afebrile with a normal white count of  8000.  I would recommend continued control of her diabetes and treatment  with broad-spectrum antibiotics and leg elevation.  Otherwise, she has no  indication for surgery at this point, as she does not have any abscess or  compromise of her skin and very little cellulitis left.  Since a lot of her  cellulitis seems to be located around her foot, she can follow up in the  foot clinic as an outpatient.  Please call if she develops any worsening but  otherwise, we will follow from a distance as she seems to be resolving  without any surgical therapy.                                               Ollen Gross. Vernell Morgans, M.D.    PST/MEDQ  D:   04/03/2004  T:  04/04/2004  Job:  [id number]

## 2011-05-09 NOTE — Discharge Summary (Signed)
[name], [name]            ACCOUNT NO.:  [id number]   MEDICAL RECORD NO.:  [id number]          PATIENT TYPE:  INP   LOCATION:  1537                         FACILITY:  [facility]   PHYSICIAN:  [name], M.D.  DATE OF BIRTH:  1946-02-12   DATE OF ADMISSION:  02/12/2008  DATE OF DISCHARGE:  02/17/2008                               DISCHARGE SUMMARY   FINAL DIAGNOSES:  1. Diabetes mellitus with peripheral circulatory disease, type 2.  2. Cellulitis of the leg.  3. Chronic systolic left heart failure.  4. Obesity.  5. Lower leg ulcer.  6. Congestive heart failure, left-sided, chronic.  7. Hypertension.  8. Hyperlipidemia.   DISCHARGE MEDICATIONS:  1. Zyrtec 10 mg 1 daily.  2. Lasix 80 mg 1 twice daily.  3. Darvocet-N 100  one upto 4 times dailyas needed.  4. Nexium 40 mg daily.  5. Metoprolol Tartrate 50 mg  one twice daily.  6. Levothyroxine 50 mcg. one daily.  7. Amitriptyline  50 mg. at bed time.  8. Lisinopril 20 mg. one daily.  9. Clonidine 0.1 mg twice daily.  10.Zocor 20 mg. one in evening.  11.Aspirin 325 mg 1 daily.  12.NPH insulin 30 units in the morning, 15 units in the evening      subcutaneously as directed.  13.Cipro 500 mg twice daily for  4-5 days.  14.Ferrous Sulfate 325 mg. one twice daily.   DISCHARGE DIET:  Low-sodium, heart-healthy diet and a moderate-calorie  carbohydrate-modified modified diet and a 1500 mL per day fluid  restriction.   DISCHARGE ACTIVITY:  The patient is to increase activity slowly.   SPECIAL INSTRUCTIONS:  The patient to stop any activity that causes  chest pain, shortness of breath, dizziness,  or excessive weakness.   FOLLOWUP:  Follow up by Dr. [name].  The patient to call [phone].   HISTORY:  This 65 year old black female with chronic lower extremity  ulcer and diabetes and recurrent heart failure had progressive swelling  of the lower extremity along with weeping of the wounds in the lower  extremity  without fever, chills or chest pain.   PHYSICAL EXAMINATION:  VITAL SIGNS:  Temperature 97.5, pulse 76,  respiration 18, blood pressure  150/70.  Height 5 feet,  1 inch.  Weight  240 lbs.  GENERAL:  The patient is alert and oriented x 3, in no acute distress.  HEENT:  Head is normocephalic, atraumatic.  Brown eyes.  Extraocular  movements are intact.  Conjunctivae are pink.  Sclerae are nonicteric.  Ear, nose and throat grossly unremarkable.  NECK:  No JVD.  No carotid bruits.  LUNGS:  Clear bilaterally.  HEART:  Normal S1 and S2 with a grade 2/6 systolic murmur.  ABDOMEN:  Soft, distended, but nontender.  EXTREMITIES:  No cyanosis or clubbing.  There is 2+ edema extending up  to both knees.  NEUROLOGICAL:  Grossly intact.  The patient moves all 4 extremities, and  she is right-handed.   LABORATORY DATA:  Normal WBC count, platelet count.  Slightly low  hemoglobin of 9.8 and 29.8.  Electrolytes near normal.  Glucose elevated  at 216.  Albumin low at 2.8.  Total protein normal.  Alkaline  phosphatase high at 291.  Iron level low at 37.  Saturation 18.  EKG  showed normal sinus rhythm.   HOSPITAL COURSE:  The patient was admitted to telemetry unit.  She  received IV antibiotic and IV Lasix and iron supplement.  She had a  wound care consult.  The patient's medications were adjusted, including  the adjustment of the insulin dose.  Her activity was increased, and on  February 17, 2008, she was discharged home in satisfactory condition  with a followup by me in 2 weeks.      [name], M.D.  Electronically Signed     ASK/MEDQ  D:  05/09/2008  T:  05/09/2008  Job:  [id number]

## 2011-05-09 NOTE — Op Note (Signed)
NAMELEONTINA, [name]            ACCOUNT NO.:  [id number]   MEDICAL RECORD NO.:  [id number]          PATIENT TYPE:  INP   LOCATION:  0098                         FACILITY:  [facility]   PHYSICIAN:  Anselm Pancoast. Weatherly, M.D.DATE OF BIRTH:  03-15-1946   DATE OF PROCEDURE:  02/07/2006  DATE OF DISCHARGE:  02/07/2006                                 OPERATIVE REPORT   PREOPERATIVE DIAGNOSIS:  Ischemic infected right second and fifth toe.   OPERATIONS:  Amputation right second and fifth toes, MP joint.   ANESTHESIA:  Local with IV supplementation.   SURGEON:  Dr. Consuello Bossier.   HISTORY:  [name] is a 65 year old diabetic of longstanding, whom I  saw in the office on Wednesday.  She has had a chronic infection, kind of a  smoldering cellulitis and sort of a sausage toe type swelling of the second  toe, and Dr. Lurene Shadow has seen her on numerous occasions, and she has been  seen by the visiting nurses, etc., and he had obtained a phased bone scan  that shows osteomyelitis of the right second and fifth toe.  On examination,  there was obviously not going to be healing of the second toe.  There is an  ulcer with surrounding little infection over a callus on the fifth toe, and  I recommended that she go ahead and have a simple toe amputations on these 2  digits, and hopefully things could heal and get good skin coverage.  The  patient's regular physician is Dr. Orpah Cobb, who is managing her  diabetes, and I called and talked with Dr. Lurene Shadow.  He said he had no OR  time available for a week, and the patient desired to proceed sooner than  that, and I could do it this morning.  She has been on chronic Cipro and  Flagyl, and this was renewed when I saw her in the office on Wednesday, and  she was scheduled for an outpatient removal of these 2 digits today.  Yesterday, when she arrived, her lab studies showed a white count of 12,200,  and her glucose at that time was 194.   This  morning, she was given 400 mg of Cipro intravenously, the patient  identified, the permit, the time-out, etc., all met and then taken to the  operative suite.  I am planning on injecting local anesthesia at the base of  both of these digits then basically proceed with the amputation with local,  but we will give her a small amount of sedation.  The patient was positioned  on the OR table, and then the foot was prepped, first with Betadine scrub  and then solution, cleaning well between the toes, etc., and then the foot  was draped sterilely.  I then used about probably 6-7 mL of 1% plain  Xylocaine.  I injected from the top of the foot into the web spaces on the  first and second and fourth web spaces where the full nerve should be  anesthetized and then first proceeded with a simple amputation of the fifth  toe since this was a lot less  clinically infected.  The flexor and extensor  tendons were divided, and then a small bone cutter was used to amputate the  toe right at the MP joint.  A few little vessels were lightly coagulated,  and then the skin was closed with simple sutures of 4-0 nylon.  The second  toe was managed similarly.  The swelling is more and after the skin flaps  were raised and the flexor and extensor tendons divided, used the little  bone cutter to amputate the toe really right at the MP joint area.  There  were just a few little vessels that required coagulation of the digital  arteries, and there was really no bleeding from either the medial or lateral  vessel that actually goes to the second toe.  I cultured this wound and also  sent a little of the tissue.  It does not look like it is an active deeper  infection and then closed this with 3-0 simple nylon sutures then used a  little quarter-inch Penrose drain in case there would be further drainage.  A little dressing with a small amount of Betadine was placed around the  incisions and then held in place with Kerlix.   The patient tolerated the  procedure nicely and I think that she can be released and followed in the  office on Monday for a wound check.  We will continue her on the Cipro and  Flagyl and then hopefully start doing with warm soaks early next week and  hopefully get this to get good soft tissue coverage.  The plantar surface of  her foot does not appear involved, so weightbearing, etc., should not be an  issue.           ______________________________  Anselm Pancoast. Zachery Dakins, M.D.     WJW/MEDQ  D:  02/07/2006  T:  02/07/2006  Job:  [id number]

## 2011-05-09 NOTE — Assessment & Plan Note (Signed)
[facility]   NAMEMarland [name]  [name], [name]            ACCOUNT NO.:  [id number]   MEDICAL RECORD NO.:  [id number]      DATE OF BIRTH:  12-14-1946   PHYSICIAN:  Maxwell Caul, M.D. VISIT DATE:  08/04/2007                                   OFFICE VISIT   HISTORY OF PRESENT ILLNESS:  The purpose of today's visit, a 65 year old  woman that we have been following for multiple bilateral stasis  ulceration.  She was last seen here on August 7.  She underwent  triamcinolone and an Unna wrap.  This was covered with a Profore.  She  reports no interim pain.  However, she did note some excoriation and  blistering just below her left knee medially.   PHYSICAL EXAMINATION:  On examination of bilateral lower extremities,  there are no open areas.  She continues to have venous stasis changes.  However, all of her wounds appear to have healed over.  She is afebrile  with a temperature of 98.3.   IMPRESSION:  Bilateral venous stasis ulcers.  I have returned her to a  Profore wrap with TCA underneath.  We will see her next week at which  time she is to bring in her own graded pressure stockings that she  states she has.  I do not think she was wearing these when the wounds  developed, however.  I was somewhat concerned about the area under the  medial left knee, whether she may need above knee stockings is the issue  here.  Nevertheless, for now, we will simply re-wrap her legs and have  her come in with her stockings in anticipation that she will be healed.           ______________________________  Maxwell Caul, M.D.     MGR/MEDQ  D:  08/04/2007  T:  08/05/2007  Job:  [id number]

## 2011-05-09 NOTE — Discharge Summary (Signed)
[place]  Patient:    [name], [name]                   MRN: [id number] Adm. Date:  [id number] Disc. Date: 06/24/00 Attending:  Orpah Cobb S                           Discharge Summary  PRINCIPAL DIAGNOSES:  1. Left foot open wound with a Staphylococcus aureus infection.  2. Right foot infection with cellulitis.  3. Diabetes mellitus with neuropathy.  4. Morbid obesity.  5. Hypertension.  6. Hyperlipidemia.  PRINCIPAL PROCEDURES:  Incision and drainage of left toe infection by Dr. Lurene Shadow on June 13, 2000.  DISCHARGE MEDICATIONS:  1. Humulin N 50 units subcutaneously daily in the morning and 25 units     subcutaneously in the evening.  2. Pepcid 20 mg daily.  3. Claritin D one twice daily.  4. Lipitor 10 mg daily daily.  5. K-Dur 10 mEq twice daily.  6. Glucotrol XL 5 mg two tablets in the morning and one tablet in the     evening.  7. Elavil 50 mg at bedtime.  8. Lasix 40 mg daily.  9. Plendil 5 mg daily. 10. Metoprolol 50 mg one twice daily. 11. Aspirin 325 mg one daily. 12. Naprosyn 500 mg one daily as needed. 13. Cipro 500 mg one twice daily for additional two weeks.  DISCHARGE ACTIVITY:  As tolerated.  DISCHARGE DIET:  Low-fat, low-salt diet, with a 1200-calorie diabetic diet.  WOUND CARE INSTRUCTIONS:  Daily dressing change as arranged and obtained by the nursing unit.  FOLLOW-UP:  Dr. Lurene Shadow in one week and by Dr. Algie Coffer in two weeks.  Patient to call for appointment at [facility] wound care center in the a.m. for dressing change.  HISTORY OF PRESENT ILLNESS:  This 65 year old black female had two separate incidences of thumb tack injuries to right heel 10 days prior to hospital admission and left great toe three days prior to admission along with some discharge from both the wounds.  History of similar injury in the past with cellulitis requiring I&D of right dorsum of foot.  She has a  longstanding history of diabetes, hypertension, morbid obesity.  PHYSICAL EXAMINATION:  VITAL SIGNS:  Temperature 100.7, pulse 97, respirations 20, blood pressure 146/76, height 5 feet, 2 inches, weight 276.8 pounds.  GENERAL:  Patient is alert and oriented x 3.  HEENT:  Head normocephalic and atraumatic.  Eyes brown, pupils equal, reactive to light.  Extraocular movements intact.  Conjunctivae pink.  Sclerae nonicteric.  Ears, nose, throat:  Mucous membranes pink and moist.  NECK:  No JVD, no carotid bruits.  LUNGS:  Clear to auscultation.  HEART:  Normal S1, S2, with grade 2/6 systolic murmur.  ABDOMEN:  Soft and nontender.  EXTREMITIES:  No cyanosis or clubbing with mild swelling of right heel with a puncture wound in the left great toe with ecchymosis and abrasions, and mild swelling.  CNS:  Patient moves all four extremities with decreased sensation in both feet.  LABORATORY DATA:  Elevated WBC count of 23,000 and a hemoglobin 11, hematocrit 32.3, platelet count 488, sodium 138, potassium 3.8, chloride 103, glucose 104, BUN 17, creatinine 1.  Vancomycin level was 12 mcg per ml.  Wound culture showed abundant Staphylococcus aureus resistant to levofloxacin and penicillin and repeat WBC count was 30,800 on June 15, 2000.  X-ray of the  foot showed no osteomyelitis or soft tissue gas.  HOSPITAL COURSE:  Patient was admitted to telemetry unit.  She underwent incision and drainage of left toe infection.  She was started on IV vancomycin 1 g daily which was adjusted by pharmacy to achieve adequate trough levels. Her condition improved rather slowly in spite of I&D.  She initially had a significant drop in blood sugars requiring a reduction in her insulin dosages. Subsequently, she required increasing her insulin dosage back to her baseline. She was advised to reduce calorie intake as much as possible and she was also advised to ambulate as much as possible.  She was given IV  Lovenox prophylactically.  Her overall condition remains stable and nursing staff was able to teach patients husband for wound care and necessary dressing change. Hence, patient was discharged home in stable condition with follow-up by me in two weeks, by Dr. Lurene Shadow in one week and by hospital outpatient service for overview of her dressing change and provide PLS therapy. DD:  06/24/00 TD:  06/24/00 Job: [id number]

## 2011-05-09 NOTE — Assessment & Plan Note (Signed)
[facility]   NAMEMarland Kitchen  [name], [name]            ACCOUNT NO.:  [id number]   MEDICAL RECORD NO.:  [id number]      DATE OF BIRTH:  05-20-1946   PHYSICIAN:  Jake Shark A. Tanda Rockers, M.D. VISIT DATE:  07/20/2007                                   OFFICE VISIT   SUBJECTIVE:  [name] returns for follow up of bilateral stasis  ulcers.  In the interim, she has worn Una wraps.  She denies excessive  drainage, malodor, pain, or fever.   OBJECTIVE:  Blood pressure is 148/66, respirations 18, pulse rate 67,  temperature is 82, capillary blood glucose is 51 mg%.  The patient is  alert, oriented, in good contact with reality, and moves all 4s.  Inspection of her lower extremities shows that there has been clinical  improvement of all areas of ulceration.  These wounds were measured,  cataloged, and photographed.  Please refer to the entries into the wound  expert.   ASSESSMENT:  Clinical improvement of stasis ulcers.   PLAN:  We are replacing the patient in the [facility] wraps.  We will  reevaluate her in 1 week.  There is no evidence of active infection or  ischemic compromise.      Harold A. Tanda Rockers, M.D.  Electronically Signed     HAN/MEDQ  D:  07/20/2007  T:  07/21/2007  Job:  [id number]

## 2011-05-09 NOTE — Discharge Summary (Signed)
NAMEKIELI, GOLLADAY            ACCOUNT NO.:  [id number]   MEDICAL RECORD NO.:  [id number]          PATIENT TYPE:  INP   LOCATION:  5014                         FACILITY:  [facility]   PHYSICIAN:  [name], M.D.  DATE OF BIRTH:  07-18-1946   DATE OF ADMISSION:  06/06/2005  DATE OF DISCHARGE:  06/09/2005                                 DISCHARGE SUMMARY   PRINCIPAL DIAGNOSES:  1.  Bilateral lower extremity cellulitis.  2.  Diabetes mellitus type two.  3.  Hypertension.  4.  Obesity.   DISCHARGE MEDICATIONS:  1.  Humulin N 30 units subcutaneously in the morning.  The patient to adjust      dose as directed.  2.  Darvocet-N 100 one twice daily.  3.  Zocor 40 mg one at bedtime.  4.  Lisinopril 10 mg one daily.  5.  Clonidine 0.1 mg one twice daily.  6.  Amitriptyline 50 mg one at bedtime.  7.  Synthroid 50 mcg one daily.  8.  Glucotrol XL 10 mg one daily.  9.  Lasix 40 mg one daily.  May increase dose to twice daily as needed.  10. Cipro 500 mg one twice daily for 10 days.  11. Claritin 10 mg one daily.  12. Robitussin DM two teaspoons four times daily.  13. Tylenol or Darvocet as directed for pain management.   FOLLOW UP:  Dr. Algie Coffer in two weeks.  The patient to call [phone] for  appointment.   CONDITION ON DISCHARGE:  Improved.   DISCHARGE DIET:  Low fat, low salt, low calorie, carbohydrate modified diet.   ACTIVITY:  The patient to increase activity as tolerated.   WOUND CARE:  The patient to apply dry dressing as needed.   SPECIAL INSTRUCTIONS:  If the fasting sugar is 110-140 keep same insulin  dose.  If blood sugar is over 200 increase a.m. dose of insulin by five  units.  If blood sugar is less than 110 decrease a.m. dose of insulin to  five units.   HISTORY OF PRESENT ILLNESS:  This is a 65 year old black female with a two  day history of bilateral lower leg swelling with redness and blister  formation in the left lower leg and minimal redness of the  right lower leg.  The patient had history of cellulitis in the past and most antibiotics gave  her rash except for Cipro and Vancomycin.   PAST MEDICAL HISTORY:  Positive for diabetes for 19 years.  Hypertension for  30 years.  Obesity.  Elevated cholesterol level.   PHYSICAL EXAMINATION:  VITAL SIGNS:  Temperature 97, pulse 70, respirations  18, blood pressure 120/50, height 5 feet 2 inches, weight 250 pounds.  GENERAL:  The patient is alert and oriented x3 with a 98% oxygen saturation  on room air.  HEENT:  The patient is normocephalic, atraumatic with brown eyes.  Pupils  reactive to light.  Extraocular movement intact.  Conjunctivae are pale.  Ears, nose, throat and mucous membrane pink and moist.  NECK:  No JVD, no carotid bruits.  LUNGS:  Clear bilaterally.  HEART:  Normal  S1 and S2 and grade 2/6 systolic murmur.  ABDOMEN:  Soft, distended but nontender.  EXTREMITIES:  No clubbing, cyanosis but 2+ edema with mild anterior redness  on the right lower extremity and area of redness with a small blister  formation in the left lower extremity.  NEUROLOGIC:  Cranial nerves II-XII grossly intact.  The patient moves all  four extremities.   LABORATORY DATA:  Normal WBC count.  Hemoglobin low at 10.7, hematocrit  32.2, platelet count normal at 360,000.  Normal PT INR.  Normal  electrolytes.  Sugar elevated at 235.  Liver enzymes slightly elevated.  Beta natriuretic peptide 48.  Cholesterol 136, triglycerides 107, HDL  cholesterol 43, LDL cholesterol 72.  TSH 1.89, blood cultures negative x2.  Wound culture negative.   HOSPITAL COURSE:  The patient was admitted to a regular floor.  She had IV  antibiotic Vancomycin and Cipro and her home medications.  Her condition  improved remarkably in 72 hours with IV antibiotic use and she was converted  to p.o. Cipro 500 mg twice daily and she was discharged home in satisfactory  condition with followup by me in two weeks.      [name], M.D.  Electronically Signed     ASK/MEDQ  D:  08/13/2005  T:  08/13/2005  Job:  [id number]

## 2011-05-09 NOTE — Discharge Summary (Signed)
[name], [name]                      ACCOUNT NO.:  [id number]   MEDICAL RECORD NO.:  [id number]                   PATIENT TYPE:  INP   LOCATION:  3727                                 FACILITY:  [facility]   PHYSICIAN:  [name], M.D.               DATE OF BIRTH:  December 19, 1946   DATE OF ADMISSION:  04/02/2004  DATE OF DISCHARGE:  04/05/2004                                 DISCHARGE SUMMARY   PRINCIPAL/FINAL DIAGNOSES:  1. Cellulitis of the leg.  2. Diabetes mellitus type 2.  3. Hypertension.  4. Morbid obesity.   DISCHARGE MEDICATIONS:  1. Glucotrol XL 10 mg one in the morning, 5 in the evening/  2. Decreased Humulin dose to 45 units in the morning and 15 units in the     evening.  3. Aciphex 20 mg one daily.  4. Clonidine 0.1 mg twice daily.  5. Lisinopril 10 mg one daily.  6. Lopressor 50 mg one twice daily.  7. K-Dur 20 mEq one twice daily.  8. Synthroid 50 mcg one daily.  9. Amitriptyline 50 mg one at bedtime.  10.      Vicodin 5/500 mg one twice daily as needed only.  11.      Lasix 40 mg one twice daily.  12.      Cipro 400 mg twice daily for 10 days.   DISCHARGE ACTIVITIES:  As tolerated.   DISCHARGE DIET:  Low fat, low salt, 1,500 calorie diabetic diet.   FOLLOW UP:  By Dr. Orpah Cobb in two weeks.   HISTORY:  This 65 year old black female presented with bilateral lower  extremity swelling and redness, worsening for over one week's duration. The  patient had history of cellulitis in the past, and most antibiotics had  given her rash except for Cipro and vancomycin. She has a past medical  history of diabetes for 18 years, hypertension for 30 years, and has history  of obesity.   PHYSICAL EXAMINATION:  VITAL SIGNS:  Temperature 98.2, pulse 74,  respirations 20, blood pressure 121/59, height 5 feet 2 inches, weight 252  pounds.  GENERAL:  The patient was alert and oriented x3. Oxygen saturation was 98%.  HEENT:  Normocephalic, atraumatic. Eyes:   Manson Passey. Pupils are equal, round,  and reactive to light. Extraocular movements intact. Ears, nose, and throat:  ____________ mucous membrane pink and moist.  NECK:  No JVD. No carotid bruit.  LUNGS:  Clear bilaterally.  HEART:  Normal S1 and S2 with grade 2/6 systolic murmur.  ABDOMEN:  Soft, nontender.  EXTREMITIES:  No cyanosis or clubbing. There was, however, 1+ edema and  redness with almost blistering of the skin over both lower extremities along  with tenderness on palpation.  CENTRAL NERVOUS SYSTEM:  The patient moved all four extremities.   LABORATORY DATA:  Revealed hemoglobin of 9.7, hematocrit of 29, WBC count of  14,000, platelet count of 551,000. Subsequent WBC count  was down to 6.8 on  April 04, 2004, and platelet count was unchanged, and hemoglobin and  hematocrit was unchanged. PT was 13.3. Electrolytes were normal except for  creatinine of 1.8, and sugar was borderline at 100. Liver enzymes were  normal except for slightly elevated alkaline phosphatase of 168. B-type  natriuretic peptide was 195. Iron levels were normal. Vancomycin trough  level was normal at 12. Urinalysis was unremarkable. Blood cultures were  negative for five days.   Chest x-ray revealed cardiomegaly without congestive heart failure. EKG  showed normal sinus rhythm with a nonspecific T-wave abnormality. Lower  extremity Doppler was negative for DVT, superficial thrombosis, or Baker's  cyst bilaterally.   HOSPITAL COURSE:  The patient was admitted to telemetry unit. She was  received IV antibiotics of vancomycin. Blood cultures were ordered, and she  had surgical consult. Her condition improved markedly in the next 48 to 72  hours of IV vancomycin, and surgical consultation was of opinion to continue  medical therapy. Hence, patient was discharged home in satisfactory  condition on April 05, 2004 with Cipro 500 mg twice daily for an additional  10 days and outpatient followup by me or by surgery in  two weeks.                                                [name], M.D.    ASK/MEDQ  D:  05/14/2004  T:  05/15/2004  Job:  [id number]

## 2011-06-20 ENCOUNTER — Encounter (HOSPITAL_BASED_OUTPATIENT_CLINIC_OR_DEPARTMENT_OTHER): Payer: Medicare Other | Attending: General Surgery

## 2011-06-20 DIAGNOSIS — E1149 Type 2 diabetes mellitus with other diabetic neurological complication: Secondary | ICD-10-CM | POA: Insufficient documentation

## 2011-06-20 DIAGNOSIS — E039 Hypothyroidism, unspecified: Secondary | ICD-10-CM | POA: Insufficient documentation

## 2011-06-20 DIAGNOSIS — E1142 Type 2 diabetes mellitus with diabetic polyneuropathy: Secondary | ICD-10-CM | POA: Insufficient documentation

## 2011-06-20 DIAGNOSIS — I10 Essential (primary) hypertension: Secondary | ICD-10-CM | POA: Insufficient documentation

## 2011-06-20 DIAGNOSIS — S98139A Complete traumatic amputation of one unspecified lesser toe, initial encounter: Secondary | ICD-10-CM | POA: Insufficient documentation

## 2011-06-20 DIAGNOSIS — I872 Venous insufficiency (chronic) (peripheral): Secondary | ICD-10-CM | POA: Insufficient documentation

## 2011-06-20 DIAGNOSIS — L97209 Non-pressure chronic ulcer of unspecified calf with unspecified severity: Secondary | ICD-10-CM | POA: Insufficient documentation

## 2011-06-20 DIAGNOSIS — E785 Hyperlipidemia, unspecified: Secondary | ICD-10-CM | POA: Insufficient documentation

## 2011-06-20 DIAGNOSIS — K219 Gastro-esophageal reflux disease without esophagitis: Secondary | ICD-10-CM | POA: Insufficient documentation

## 2011-06-20 DIAGNOSIS — Z79899 Other long term (current) drug therapy: Secondary | ICD-10-CM | POA: Insufficient documentation

## 2011-06-20 DIAGNOSIS — J4489 Other specified chronic obstructive pulmonary disease: Secondary | ICD-10-CM | POA: Insufficient documentation

## 2011-06-20 DIAGNOSIS — J449 Chronic obstructive pulmonary disease, unspecified: Secondary | ICD-10-CM | POA: Insufficient documentation

## 2011-06-22 NOTE — Assessment & Plan Note (Unsigned)
[facility]  NAMEMarland [name]  [name], [name]            ACCOUNT NO.:  [id number]  MEDICAL RECORD NO.:  [id number]      DATE OF BIRTH:  03-28-1946  PHYSICIAN:  Leonie Man, M.D.    VISIT DATE:  06/20/2011                                  OFFICE VISIT   PROBLEM:  Ulcer, left lateral calf.  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  She has been treated at this W[facility] in the past, but has not been using compression stockings prophylactically to prevent recurrence.  She complains of sharp leg pain in a range of 6-8/10, relieved by nothing.  PAST MEDICAL AND SURGICAL HISTORY: 1. Type 2 diabetes with neuropathy. 2. COPD. 3. Hypertension. 4. Hypothyroidism. 5. Gout. 6. Obesity. 7. Hyperlipidemia. 8. Gastroesophageal reflux. 9. She has undergone tonsillectomy, appendectomy, total abdominal     hysterectomy. 10.She has had 1 toe amputated on the right leg. 11.She has had cholecystectomy, ovarian cyst removal, and cardiac     catheterizations in the past.  Current medications are Nexium, Synthroid, Glucotrol XL, Levemir, metoprolol, clonidine, simvastatin, Elavil, hydrocodone, metoclopramide, Colace, and multivitamins.  ALLERGIES:  PENICILLIN.  SOCIAL HISTORY:  Please refer to her previous clinic records for her social history.  Review of systems is negative except as above.  PHYSICAL EXAMINATION:  VITAL SIGNS:  This patient is 6 feet 2 inches weighing 220 pounds, temperature 98.2, pulse 106, respirations 18, blood pressure 188/111. HEAD AND NECK:  Head is normocephalic.  Pupils are anicteric. Oropharynx benign.  No thyromegaly.  No palpable cervical adenopathy. LUNGS:  Clear to auscultation bilaterally. HEART:  Regular rate and rhythm with no murmurs. ABDOMEN:  Soft, nontender, nondistended.  Normoactive bowel sounds without palpable masses or visceromegaly. EXTREMITIES:  The distal pulses are not palpable.   She does have significant edema.  Ankle-brachial indexes measured at 0.9.  The wound measures 0.5 x 1.1 x 0.1.  This is overlying area of 11.8 x 8.5 area of thin skin from a previously healed ulcer.  Calf measurements right/left is 46/54 with significant positive edema.  The base of the wound is clean and pink.  There is no odor or drainage.  TREATMENT PLAN:  Today, the wound is clean; and therefore, no debridement is done today.  Culture is taken.  Wound treatment with Santyl and Medihoney with hydrogel.  Unna boot is placed on for compression.  I will start her on doxycycline 100 mg twice daily for the next 10 days.  Followup will be in 1 week.     Leonie Man, M.D.     PB/MEDQ  D:  06/21/2011  T:  06/22/2011  Job:  [id number]

## 2011-06-27 ENCOUNTER — Encounter (HOSPITAL_BASED_OUTPATIENT_CLINIC_OR_DEPARTMENT_OTHER): Payer: Medicare Other | Attending: General Surgery

## 2011-06-27 DIAGNOSIS — E039 Hypothyroidism, unspecified: Secondary | ICD-10-CM | POA: Insufficient documentation

## 2011-06-27 DIAGNOSIS — E1149 Type 2 diabetes mellitus with other diabetic neurological complication: Secondary | ICD-10-CM | POA: Insufficient documentation

## 2011-06-27 DIAGNOSIS — I10 Essential (primary) hypertension: Secondary | ICD-10-CM | POA: Insufficient documentation

## 2011-06-27 DIAGNOSIS — J4489 Other specified chronic obstructive pulmonary disease: Secondary | ICD-10-CM | POA: Insufficient documentation

## 2011-06-27 DIAGNOSIS — L97209 Non-pressure chronic ulcer of unspecified calf with unspecified severity: Secondary | ICD-10-CM | POA: Insufficient documentation

## 2011-06-27 DIAGNOSIS — E785 Hyperlipidemia, unspecified: Secondary | ICD-10-CM | POA: Insufficient documentation

## 2011-06-27 DIAGNOSIS — I872 Venous insufficiency (chronic) (peripheral): Secondary | ICD-10-CM | POA: Insufficient documentation

## 2011-06-27 DIAGNOSIS — E1142 Type 2 diabetes mellitus with diabetic polyneuropathy: Secondary | ICD-10-CM | POA: Insufficient documentation

## 2011-06-27 DIAGNOSIS — K219 Gastro-esophageal reflux disease without esophagitis: Secondary | ICD-10-CM | POA: Insufficient documentation

## 2011-06-27 DIAGNOSIS — Z79899 Other long term (current) drug therapy: Secondary | ICD-10-CM | POA: Insufficient documentation

## 2011-06-27 DIAGNOSIS — J449 Chronic obstructive pulmonary disease, unspecified: Secondary | ICD-10-CM | POA: Insufficient documentation

## 2011-06-27 DIAGNOSIS — S98139A Complete traumatic amputation of one unspecified lesser toe, initial encounter: Secondary | ICD-10-CM | POA: Insufficient documentation

## 2011-07-03 ENCOUNTER — Encounter (INDEPENDENT_AMBULATORY_CARE_PROVIDER_SITE_OTHER): Payer: Medicare Other

## 2011-07-03 DIAGNOSIS — L97909 Non-pressure chronic ulcer of unspecified part of unspecified lower leg with unspecified severity: Secondary | ICD-10-CM

## 2011-07-15 NOTE — Procedures (Unsigned)
DUPLEX DEEP VENOUS EXAM - LOWER EXTREMITY  INDICATION:  Left lower extremity ulcer.  HISTORY:  Edema:  Yes. Trauma/Surgery:  No. Pain:  No. PE:  No. Previous DVT:  Unsure. Anticoagulants: Other:  DUPLEX EXAM:               CFV   SFV   PopV  PTV    GSV               R  L  R  L  R  L  R   L  R  L Thrombosis    o  o  o  o Spontaneous   +  +  +  + Phasic        +  +  +  + Augmentation  +  +  +  + Compressible  +  +  +  + Competent  Legend:  + - yes  o - no  p - partial  D - decreased  IMPRESSION: 1. Limited and technically difficult study due to edema, body habitus,     and severe venous hypertension. 2. No evidence of deep venous thrombosis in the bilateral common     femoral veins in the areas of the superficial femoral veins where     visualized.  Attempts to demonstrate reflux were not successful due     to venous hypertension.   _____________________________ V. Charlena Cross, MD  LT/MEDQ  D:  07/03/2011  T:  07/03/2011  Job:  [id number]

## 2011-07-22 ENCOUNTER — Emergency Department (HOSPITAL_COMMUNITY): Payer: Medicare Other

## 2011-07-22 ENCOUNTER — Emergency Department (HOSPITAL_COMMUNITY)
Admission: EM | Admit: 2011-07-22 | Discharge: 2011-07-22 | Disposition: A | Discharge status: Home / Self Care | Payer: Medicare Other | Source: Home / Self Care | Attending: Emergency Medicine | Admitting: Emergency Medicine

## 2011-07-22 DIAGNOSIS — K5900 Constipation, unspecified: Secondary | ICD-10-CM | POA: Insufficient documentation

## 2011-07-22 DIAGNOSIS — F0390 Unspecified dementia without behavioral disturbance: Secondary | ICD-10-CM | POA: Insufficient documentation

## 2011-07-22 DIAGNOSIS — I10 Essential (primary) hypertension: Secondary | ICD-10-CM | POA: Insufficient documentation

## 2011-07-22 DIAGNOSIS — E119 Type 2 diabetes mellitus without complications: Secondary | ICD-10-CM | POA: Insufficient documentation

## 2011-07-23 ENCOUNTER — Inpatient Hospital Stay (HOSPITAL_COMMUNITY): Payer: Medicare Other

## 2011-07-23 ENCOUNTER — Inpatient Hospital Stay (HOSPITAL_COMMUNITY)
Admission: AD | Admit: 2011-07-23 | Discharge: 2011-07-29 | DRG: 292 | Disposition: A | Discharge status: Skilled Nursing Facility | Payer: Medicare Other | Source: Ambulatory Visit | Attending: Cardiovascular Disease | Admitting: Cardiovascular Disease

## 2011-07-23 DIAGNOSIS — I509 Heart failure, unspecified: Secondary | ICD-10-CM | POA: Diagnosis present

## 2011-07-23 DIAGNOSIS — E669 Obesity, unspecified: Secondary | ICD-10-CM | POA: Diagnosis present

## 2011-07-23 DIAGNOSIS — I5023 Acute on chronic systolic (congestive) heart failure: Principal | ICD-10-CM | POA: Diagnosis present

## 2011-07-23 DIAGNOSIS — I129 Hypertensive chronic kidney disease with stage 1 through stage 4 chronic kidney disease, or unspecified chronic kidney disease: Secondary | ICD-10-CM | POA: Diagnosis present

## 2011-07-23 DIAGNOSIS — I2510 Atherosclerotic heart disease of native coronary artery without angina pectoris: Secondary | ICD-10-CM | POA: Diagnosis present

## 2011-07-23 DIAGNOSIS — N390 Urinary tract infection, site not specified: Secondary | ICD-10-CM | POA: Diagnosis present

## 2011-07-23 DIAGNOSIS — N179 Acute kidney failure, unspecified: Secondary | ICD-10-CM | POA: Diagnosis present

## 2011-07-23 DIAGNOSIS — F0390 Unspecified dementia without behavioral disturbance: Secondary | ICD-10-CM | POA: Diagnosis present

## 2011-07-23 DIAGNOSIS — S98139A Complete traumatic amputation of one unspecified lesser toe, initial encounter: Secondary | ICD-10-CM

## 2011-07-23 DIAGNOSIS — N184 Chronic kidney disease, stage 4 (severe): Secondary | ICD-10-CM | POA: Diagnosis present

## 2011-07-23 DIAGNOSIS — I739 Peripheral vascular disease, unspecified: Secondary | ICD-10-CM | POA: Diagnosis present

## 2011-07-23 DIAGNOSIS — I2789 Other specified pulmonary heart diseases: Secondary | ICD-10-CM | POA: Diagnosis present

## 2011-07-23 DIAGNOSIS — I70209 Unspecified atherosclerosis of native arteries of extremities, unspecified extremity: Secondary | ICD-10-CM | POA: Diagnosis present

## 2011-07-23 DIAGNOSIS — E119 Type 2 diabetes mellitus without complications: Secondary | ICD-10-CM | POA: Diagnosis present

## 2011-07-23 LAB — COMPREHENSIVE METABOLIC PANEL
ALT: 12 U/L (ref 0–35)
Alkaline Phosphatase: 128 U/L — ABNORMAL HIGH (ref 39–117)
CO2: 28 mEq/L (ref 19–32)
Calcium: 9.1 mg/dL (ref 8.4–10.5)
Chloride: 105 mEq/L (ref 96–112)
GFR calc Af Amer: 21 mL/min — ABNORMAL LOW (ref >60)
GFR calc non Af Amer: 17 mL/min — ABNORMAL LOW (ref >60)
Glucose, Bld: 217 mg/dL — ABNORMAL HIGH (ref 70–99)
Sodium: 142 mEq/L (ref 135–145)
Total Bilirubin: 0.1 mg/dL — ABNORMAL LOW (ref 0.3–1.2)

## 2011-07-23 LAB — DIFFERENTIAL
Basophils Relative: 1 % (ref 0–1)
Lymphs Abs: 2.9 10*3/uL (ref 0.7–4.0)
Monocytes Relative: 6 % (ref 3–12)
Neutro Abs: 5.9 10*3/uL (ref 1.7–7.7)
Neutrophils Relative %: 60 % (ref 43–77)

## 2011-07-23 LAB — CBC
Hemoglobin: 8.7 g/dL — ABNORMAL LOW (ref 12.0–15.0)
MCH: 27.3 pg (ref 26.0–34.0)
MCV: 85.3 fL (ref 78.0–100.0)
RBC: 3.19 MIL/uL — ABNORMAL LOW (ref 3.87–5.11)
WBC: 9.7 10*3/uL (ref 4.0–10.5)

## 2011-07-24 LAB — LIPID PANEL
Cholesterol: 175 mg/dL (ref 0–200)
HDL: 49 mg/dL (ref >39)
Triglycerides: 145 mg/dL (ref <150)
VLDL: 29 mg/dL (ref 0–40)

## 2011-07-24 LAB — CBC
Hemoglobin: 8.7 g/dL — ABNORMAL LOW (ref 12.0–15.0)
MCH: 27.7 pg (ref 26.0–34.0)
Platelets: 446 10*3/uL — ABNORMAL HIGH (ref 150–400)
RBC: 3.14 MIL/uL — ABNORMAL LOW (ref 3.87–5.11)
WBC: 9.1 10*3/uL (ref 4.0–10.5)

## 2011-07-24 LAB — BASIC METABOLIC PANEL
CO2: 26 mEq/L (ref 19–32)
Calcium: 8.9 mg/dL (ref 8.4–10.5)
Glucose, Bld: 171 mg/dL — ABNORMAL HIGH (ref 70–99)
Potassium: 3.9 mEq/L (ref 3.5–5.1)
Sodium: 142 mEq/L (ref 135–145)

## 2011-07-24 LAB — TSH: TSH: 4.637 u[IU]/mL — ABNORMAL HIGH (ref 0.350–4.500)

## 2011-07-24 LAB — GLUCOSE, CAPILLARY
Glucose-Capillary: 238 mg/dL — ABNORMAL HIGH (ref 70–99)
Glucose-Capillary: 80 mg/dL (ref 70–99)
Glucose-Capillary: 122 mg/dL — ABNORMAL HIGH (ref 70–99)

## 2011-07-24 LAB — HEMOGLOBIN A1C: Mean Plasma Glucose: 137 mg/dL — ABNORMAL HIGH (ref <117)

## 2011-07-25 LAB — GLUCOSE, CAPILLARY
Glucose-Capillary: 257 mg/dL — ABNORMAL HIGH (ref 70–99)
Glucose-Capillary: 93 mg/dL (ref 70–99)
Glucose-Capillary: 149 mg/dL — ABNORMAL HIGH (ref 70–99)

## 2011-07-25 LAB — BASIC METABOLIC PANEL
CO2: 29 mEq/L (ref 19–32)
Chloride: 105 mEq/L (ref 96–112)
Sodium: 141 mEq/L (ref 135–145)

## 2011-07-25 LAB — CBC
Hemoglobin: 10 g/dL — ABNORMAL LOW (ref 12.0–15.0)
Platelets: 326 10*3/uL (ref 150–400)
RBC: 3.58 MIL/uL — ABNORMAL LOW (ref 3.87–5.11)
WBC: 10.8 10*3/uL — ABNORMAL HIGH (ref 4.0–10.5)

## 2011-07-25 LAB — PRO B NATRIURETIC PEPTIDE: Pro B Natriuretic peptide (BNP): 4557 pg/mL — ABNORMAL HIGH (ref 0–125)

## 2011-07-25 NOTE — H&P (Signed)
[name], [name]            ACCOUNT NO.:  [id number]  MEDICAL RECORD NO.:  [id number]  LOCATION:  3702                         FACILITY:  [facility]  PHYSICIAN:  [name], M.D.  DATE OF BIRTH:  1946-05-12  DATE OF ADMISSION:  07/23/2011 DATE OF DISCHARGE:                             HISTORY & PHYSICAL   HOSPITAL LOCATION:  3702, bed 1.  CHIEF COMPLAINT:  Bilateral leg edema.  HISTORY OF PRESENT ILLNESS:  This 65 year old black female has chronic bilateral leg edema along with history of diabetes, hypertension, coronary artery disease, elevated cholesterol, and peripheral vascular disease.  PAST MEDICAL HISTORY:  Diabetes mellitus type 2 for 30 years, hypertension for 30+ years.  No history of smoking.  No history of alcohol intake.  No history of drug intake.  Positive history of elevated cholesterol level.  Positive history of coronary artery disease.  PAST SURGICAL HISTORY:  Includes tonsillectomy age 6, appendectomy age 25, cholecystectomy and ovarian cyst removal in 1973, hysterectomy in 1985, cardiac catheterization in 1996, right toe amputation 2007.  PERSONAL HISTORY:  The patient is disabled and widowed for 20 years. Has one son and one daughter.  FAMILY HISTORY:  Mom died in her 55s and dad died in his 83s.  Has one brother and one sister, and only sister is living.  MEDICATIONS: 1. Nexium 40 mg one daily. 2. Synthroid 50 mcg one daily. 3. Lasix 80 mg twice daily. 4. Glucotrol XL 10 mg one twice daily. 5. Metoprolol 50 mg one twice daily. 6. Clonidine 0.1 mg one twice daily. 7. Simvastatin 20 mg one at bedtime. 8. Levemir insulin 20 units in the morning, 10 units in the evening. 9. Elavil 50 mg one bedtime. 10.Hydrocodone 5/100 mg one twice daily. 11.Metoclopramide 5 mg one 4 times daily. 12.Colace 100 mg one twice daily. 13.Multivitamin one daily.  ALLERGIES:  PENICILLIN.  REVIEW OF SYSTEMS:  The patient admits to weight gain off and on.   Wears reading glasses and dentures.  No history of asthma.  Positive history of chest pain.  No history of GI bleed.  No kidney stone.  No strokes, seizures, or psychiatric admissions.  Positive history of joint pain.  PHYSICAL EXAMINATION:  VITAL SIGNS:  Pulse 80, respirations 18, blood pressure 201/104.  Height 5 feet 2 inches, weight of approximately 220 pounds with body mass index of 40, temperature 97.1. HEENT:  The patient is normocephalic, atraumatic with brown eyes and hazy lens bilaterally.  Wears glasses.  Conjunctivae pale, pink. NECK:  No JVD, multiple skin tags, and full range of motion. LUNGS:  Clear bilaterally. HEART:  Normal S1, S2. ABDOMEN:  Soft, distended, but nontender. EXTREMITIES:  2+ edema.  No cyanosis or clubbing.  Large old scar over the lateral aspect of the left lower leg.  Evidence of right toe amputation. CNS:  Bilateral equal grips.  Alert, oriented x3.  She is right handed.  LABORATORY DATA:  Normal electrolytes.  BUN elevated at 30, creatinine 2.73, sugar elevated at 217.  Liver enzymes nearly normal with albumin of 3.0, somewhat low.  CBC revealed normal WBC count, platelet count slightly high at 478,000, hemoglobin low at 8.7, hematocrit 27.2.  Pro- BNP markedly elevated  at 3307.    Chest x-ray revealed cardiomegaly and pulmonary venous hypertension.   Otherwise, no acute findings.  IMPRESSION: 1. Bilateral leg edema and chronic left heart systolic failure. 2. Diabetes mellitus type 2. 3. Hypertension. 4. Obesity. 5. Peripheral vascular disease.  PLAN:  Admit the patient to telemetry, rule out myocardial infarction. Get 2-D echocardiogram.  Continue home medications, give IV Lasix.     [name], M.D.     ASK/MEDQ  D:  07/23/2011  T:  07/24/2011  Job:  [id number]  Electronically Signed by Orpah Cobb M.D. on 07/25/2011 01:32:15 PM

## 2011-07-26 LAB — GLUCOSE, CAPILLARY: Glucose-Capillary: 202 mg/dL — ABNORMAL HIGH (ref 70–99)

## 2011-07-27 ENCOUNTER — Inpatient Hospital Stay (HOSPITAL_COMMUNITY): Payer: Medicare Other

## 2011-07-27 LAB — URINALYSIS, MICROSCOPIC ONLY
Glucose, UA: 250 mg/dL — AB
Specific Gravity, Urine: 1.016 (ref 1.005–1.030)
Urobilinogen, UA: 0.2 mg/dL (ref 0.0–1.0)
pH: 7 (ref 5.0–8.0)

## 2011-07-27 LAB — GLUCOSE, CAPILLARY: Glucose-Capillary: 90 mg/dL (ref 70–99)

## 2011-07-28 LAB — GLUCOSE, CAPILLARY: Glucose-Capillary: 108 mg/dL — ABNORMAL HIGH (ref 70–99)

## 2011-07-29 LAB — GLUCOSE, CAPILLARY: Glucose-Capillary: 112 mg/dL — ABNORMAL HIGH (ref 70–99)

## 2011-07-31 NOTE — Discharge Summary (Signed)
[name], [name]            ACCOUNT NO.:  [id number]  MEDICAL RECORD NO.:  [id number]  LOCATION:  3702                         FACILITY:  [facility]  PHYSICIAN:  [name], M.D.  DATE OF BIRTH:  [birth year]  DATE OF ADMISSION:  07/23/2011 DATE OF DISCHARGE:  07/29/2011                              DISCHARGE SUMMARY   FINAL DIAGNOSES: 1. Acute-on-chronic left heart systolic failure. 2. Diabetes mellitus type 2. 3. Hypertension. 4. Obesity. 5. Dementia. 6. Peripheral vascular disease. 7. Bilateral leg edema. 8. Chronic renal insufficiency. 9. Urinary tract infection, early.  DISCHARGE MEDICATIONS: 1. Amlodipine 5 mg 1 p.o. daily. 2. Cipro 250 mg 1 twice daily for 7 days. 3. Amitriptyline decreased to 25 mg at bedtime. 4. Lasix 20 mg daily. 5. Simvastatin 20 mg at bedtime. 6. Docusate 100 mg 1 twice daily. 7. Omeprazole 40 mg 1 in the morning. 8. Glipizide 10 mg 1 daily. 9. Humulin N 20 units in the morning and 10 units at night     subcutaneously twice daily. 10.Hydrocodone/APAP 5/325 mg 1 twice daily as needed. 11.Levothyroxine 50 mcg 1 in the morning. 12.Metoclopramide 5 mg 1 before meals and at bedtime. 13.Metoprolol tartrate 50 mg 1 twice daily. 14.Multivitamin 1 daily.  DISCHARGE DIET:  Low-sodium heart-healthy, 1500 mL fluid restriction, and low-calorie carbohydrate modified diet.  DISCHARGE ACTIVITY:  The patient to increase activity as tolerated.  Use walker for ambulation.  FOLLOWUP:  Follow up by Dr. Orpah Cobb in 1 month.  CONDITION ON DISCHARGE:  Improved.  HISTORY:  This 65 year old black female presented with chronic bilateral leg edema along with past history of diabetes, hypertension, coronary artery disease, elevated cholesterol level, peripheral vascular disease, and she had some exertional dyspnea also.  PHYSICAL EXAMINATION:  VITAL SIGNS:  Pulse 80, respirations 18, blood pressure 201/104, subsequent blood pressure was down to  152/80, height 5 feet 2 inches, weight approximately 220 pounds, body mass index of 40, and temperature 97.1. HEENT:  The patient is normocephalic and atraumatic with brown eyes, hazy lens bilaterally.  Wears glasses.  Conjunctivae pale and pink. NECK:  No JVD, multiple skin tags, full range of motion. LUNGS:  Clear bilaterally. HEART:  Normal S1 and S2. ABDOMEN:  Soft, distended, but nontender. EXTREMITIES:  2+ edema down to trace edema, no cyanosis or clubbing.  A large old scar over the lateral aspect of the left lower leg and evidence of right second toe amputation. CNS:  Bilateral equal grips.  The patient is alert and oriented x3, confusion at sometimes, and she is right-handed.  LABORATORY DATA:  Normal electrolytes, BUN elevated at 30, creatinine 2.73, sugar elevated 217 mg/dl.  Liver enzymes near normal with albumin of 3.0.  CBC showed normal WBC count and platelet count slightly elevated at 478,000, hemoglobin low at 8.7.  ProBNP markedly elevated 3307. Urinalysis showed small leukocytes.    Chest x-ray revealed cardiomegaly and pulmonary venous hypertension.    Echocardiogram revealed moderate concentric hypertrophy, normal LV systolic  function with trivial aortic valve regurgitation, mild mitral valve  regurgitation, mildly elevated pulmonary arterial pressure, and mildly  dilated left atrium.  HOSPITAL COURSE:  The patient was admitted to telemetry unit.  She was given  IV Lasix with slow and steady diuresis.  Her leg swelling gradually decreased to trace edema, however, her proBNP remained somewhat elevated at 4567.  Her urinalysis showed possible early infection and she was started on Cipro.  She was transferred to area nursing home per family and patient request and she will be followed by me in 1 month.     [name], M.D.     ASK/MEDQ  D:  07/29/2011  T:  07/29/2011  Job:  [id number]  Electronically Signed by Orpah Cobb M.D. on 07/31/2011 06:00:27  PM

## 2011-08-01 ENCOUNTER — Inpatient Hospital Stay (HOSPITAL_COMMUNITY)
Admission: EM | Admit: 2011-08-01 | Discharge: 2011-08-07 | DRG: 292 | Disposition: A | Discharge status: Skilled Nursing Facility | Payer: Medicare Other | Source: Ambulatory Visit | Attending: Cardiovascular Disease | Admitting: Cardiovascular Disease

## 2011-08-01 ENCOUNTER — Emergency Department (HOSPITAL_COMMUNITY): Payer: Medicare Other

## 2011-08-01 DIAGNOSIS — N184 Chronic kidney disease, stage 4 (severe): Secondary | ICD-10-CM | POA: Diagnosis present

## 2011-08-01 DIAGNOSIS — I509 Heart failure, unspecified: Secondary | ICD-10-CM | POA: Diagnosis present

## 2011-08-01 DIAGNOSIS — E119 Type 2 diabetes mellitus without complications: Secondary | ICD-10-CM | POA: Diagnosis present

## 2011-08-01 DIAGNOSIS — I2510 Atherosclerotic heart disease of native coronary artery without angina pectoris: Secondary | ICD-10-CM | POA: Diagnosis present

## 2011-08-01 DIAGNOSIS — Z7982 Long term (current) use of aspirin: Secondary | ICD-10-CM

## 2011-08-01 DIAGNOSIS — I5033 Acute on chronic diastolic (congestive) heart failure: Principal | ICD-10-CM | POA: Diagnosis present

## 2011-08-01 DIAGNOSIS — I739 Peripheral vascular disease, unspecified: Secondary | ICD-10-CM | POA: Diagnosis present

## 2011-08-01 DIAGNOSIS — F0390 Unspecified dementia without behavioral disturbance: Secondary | ICD-10-CM | POA: Diagnosis present

## 2011-08-01 DIAGNOSIS — I129 Hypertensive chronic kidney disease with stage 1 through stage 4 chronic kidney disease, or unspecified chronic kidney disease: Secondary | ICD-10-CM | POA: Diagnosis present

## 2011-08-01 DIAGNOSIS — E669 Obesity, unspecified: Secondary | ICD-10-CM | POA: Diagnosis present

## 2011-08-01 LAB — COMPREHENSIVE METABOLIC PANEL
Alkaline Phosphatase: 110 U/L (ref 39–117)
BUN: 46 mg/dL — ABNORMAL HIGH (ref 6–23)
CO2: 27 mEq/L (ref 19–32)
Chloride: 103 mEq/L (ref 96–112)
Creatinine, Ser: 3.23 mg/dL — ABNORMAL HIGH (ref 0.50–1.10)
GFR calc non Af Amer: 14 mL/min — ABNORMAL LOW (ref >60)
Total Bilirubin: 0.1 mg/dL — ABNORMAL LOW (ref 0.3–1.2)

## 2011-08-01 LAB — DIFFERENTIAL
Eosinophils Relative: 4 % (ref 0–5)
Lymphocytes Relative: 25 % (ref 12–46)
Lymphs Abs: 2.9 10*3/uL (ref 0.7–4.0)
Monocytes Absolute: 0.7 10*3/uL (ref 0.1–1.0)
Monocytes Relative: 6 % (ref 3–12)

## 2011-08-01 LAB — CBC
HCT: 27.5 % — ABNORMAL LOW (ref 36.0–46.0)
MCH: 27.8 pg (ref 26.0–34.0)
MCHC: 33.1 g/dL (ref 30.0–36.0)
MCV: 84.1 fL (ref 78.0–100.0)
RDW: 14.4 % (ref 11.5–15.5)

## 2011-08-01 LAB — POCT I-STAT TROPONIN I: Troponin i, poc: 0 ng/mL (ref 0.00–0.08)

## 2011-08-01 LAB — LIPASE, BLOOD: Lipase: 16 U/L (ref 11–59)

## 2011-08-01 LAB — PRO B NATRIURETIC PEPTIDE: Pro B Natriuretic peptide (BNP): 2923 pg/mL — ABNORMAL HIGH (ref 0–125)

## 2011-08-02 ENCOUNTER — Observation Stay (HOSPITAL_COMMUNITY): Payer: Medicare Other

## 2011-08-02 LAB — FERRITIN: Ferritin: 28 ng/mL (ref 10–291)

## 2011-08-02 LAB — GLUCOSE, CAPILLARY
Glucose-Capillary: 142 mg/dL — ABNORMAL HIGH (ref 70–99)
Glucose-Capillary: 218 mg/dL — ABNORMAL HIGH (ref 70–99)
Glucose-Capillary: 227 mg/dL — ABNORMAL HIGH (ref 70–99)
Glucose-Capillary: 188 mg/dL — ABNORMAL HIGH (ref 70–99)

## 2011-08-02 LAB — BASIC METABOLIC PANEL
BUN: 45 mg/dL — ABNORMAL HIGH (ref 6–23)
Chloride: 107 mEq/L (ref 96–112)
Creatinine, Ser: 3.32 mg/dL — ABNORMAL HIGH (ref 0.50–1.10)
GFR calc Af Amer: 17 mL/min — ABNORMAL LOW (ref >60)
Glucose, Bld: 146 mg/dL — ABNORMAL HIGH (ref 70–99)

## 2011-08-02 LAB — PRO B NATRIURETIC PEPTIDE: Pro B Natriuretic peptide (BNP): 2592 pg/mL — ABNORMAL HIGH (ref 0–125)

## 2011-08-02 LAB — CBC
HCT: 25.8 % — ABNORMAL LOW (ref 36.0–46.0)
MCH: 27.9 pg (ref 26.0–34.0)
MCHC: 32.9 g/dL (ref 30.0–36.0)
RDW: 14.8 % (ref 11.5–15.5)

## 2011-08-02 LAB — IRON AND TIBC
Iron: 31 ug/dL — ABNORMAL LOW (ref 42–135)
UIBC: 216 ug/dL

## 2011-08-02 LAB — PROTIME-INR
INR: 1.04 (ref 0.00–1.49)
Prothrombin Time: 13.8 seconds (ref 11.6–15.2)

## 2011-08-02 LAB — HEMOGLOBIN A1C: Mean Plasma Glucose: 154 mg/dL — ABNORMAL HIGH (ref <117)

## 2011-08-02 LAB — VITAMIN B12: Vitamin B-12: 582 pg/mL (ref 211–911)

## 2011-08-02 LAB — CARDIAC PANEL(CRET KIN+CKTOT+MB+TROPI)
CK, MB: 3 ng/mL (ref 0.3–4.0)
Troponin I: <0.3 ng/mL (ref <0.30)

## 2011-08-02 LAB — TSH: TSH: 3.611 u[IU]/mL (ref 0.350–4.500)

## 2011-08-02 LAB — FOLATE: Folate: 19.5 ng/mL

## 2011-08-03 LAB — GLUCOSE, CAPILLARY: Glucose-Capillary: 217 mg/dL — ABNORMAL HIGH (ref 70–99)

## 2011-08-03 LAB — BASIC METABOLIC PANEL
Chloride: 107 mEq/L (ref 96–112)
GFR calc Af Amer: 16 mL/min — ABNORMAL LOW (ref >60)
GFR calc non Af Amer: 13 mL/min — ABNORMAL LOW (ref >60)
Potassium: 4.4 mEq/L (ref 3.5–5.1)

## 2011-08-03 LAB — PRO B NATRIURETIC PEPTIDE: Pro B Natriuretic peptide (BNP): 2657 pg/mL — ABNORMAL HIGH (ref 0–125)

## 2011-08-04 ENCOUNTER — Observation Stay (HOSPITAL_COMMUNITY): Payer: Medicare Other

## 2011-08-04 LAB — GLUCOSE, CAPILLARY
Glucose-Capillary: 217 mg/dL — ABNORMAL HIGH (ref 70–99)
Glucose-Capillary: 167 mg/dL — ABNORMAL HIGH (ref 70–99)

## 2011-08-04 MED ORDER — TECHNETIUM TC 99M TETROFOSMIN IV KIT
30.0000 | PACK | Freq: Once | INTRAVENOUS | Status: AC | PRN
  Administered 2011-08-04: 30 via INTRAVENOUS

## 2011-08-04 MED ORDER — TECHNETIUM TC 99M TETROFOSMIN IV KIT
10.0000 | PACK | Freq: Once | INTRAVENOUS | Status: AC | PRN
  Administered 2011-08-04: 10 via INTRAVENOUS

## 2011-08-05 LAB — BASIC METABOLIC PANEL
Calcium: 9 mg/dL (ref 8.4–10.5)
GFR calc Af Amer: 16 mL/min — ABNORMAL LOW (ref >60)
GFR calc non Af Amer: 13 mL/min — ABNORMAL LOW (ref >60)
Glucose, Bld: 140 mg/dL — ABNORMAL HIGH (ref 70–99)
Potassium: 4.1 mEq/L (ref 3.5–5.1)
Sodium: 139 mEq/L (ref 135–145)

## 2011-08-05 LAB — CBC
HCT: 26.3 % — ABNORMAL LOW (ref 36.0–46.0)
Hemoglobin: 8.6 g/dL — ABNORMAL LOW (ref 12.0–15.0)
MCHC: 32.7 g/dL (ref 30.0–36.0)
RDW: 14.6 % (ref 11.5–15.5)
WBC: 10.1 10*3/uL (ref 4.0–10.5)

## 2011-08-05 LAB — GLUCOSE, CAPILLARY
Glucose-Capillary: 150 mg/dL — ABNORMAL HIGH (ref 70–99)
Glucose-Capillary: 155 mg/dL — ABNORMAL HIGH (ref 70–99)
Glucose-Capillary: 290 mg/dL — ABNORMAL HIGH (ref 70–99)

## 2011-08-05 LAB — PRO B NATRIURETIC PEPTIDE: Pro B Natriuretic peptide (BNP): 2890 pg/mL — ABNORMAL HIGH (ref 0–125)

## 2011-08-06 LAB — GLUCOSE, CAPILLARY: Glucose-Capillary: 89 mg/dL (ref 70–99)

## 2011-08-07 LAB — GLUCOSE, CAPILLARY: Glucose-Capillary: 96 mg/dL (ref 70–99)

## 2011-08-11 NOTE — Discharge Summary (Signed)
NAMEJAMIESON, Erika            ACCOUNT NO.:  [id number]  MEDICAL RECORD NO.:  [id number]  LOCATION:  3738                         FACILITY:  [facility]  PHYSICIAN:  [name], M.D.  DATE OF BIRTH:  05-29-1946  DATE OF ADMISSION:  08/01/2011 DATE OF DISCHARGE:  08/07/2011                              DISCHARGE SUMMARY   ADMITTED BY:  Eduardo Osier. Sharyn Lull, MD.  DISCHARGED BY:  [name], MD.  FINAL DIAGNOSES: 1. Acute on chronic left heart diastolic failure. 2. Coronary artery disease. 3. Diabetes mellitus type 2. 4. Hypertension. 5. Obesity. 6. Dementia. 7. Peripheral vascular disease. 8. Chronic bilateral leg edema. 9. Chronic renal failure.  DISCHARGE MEDICATIONS: 1. Alprazolam 0.25 mg one twice daily. 2. Aspirin 81 mg daily. 3. Mupirocin 2% ointment apply nasally twice daily for 1 week. 4. Furosemide 40 mg one twice daily. 5. Amlodipine 5 mg one daily. 6. Docusate 100 mg over-the-counter one daily. 7. Glipizide 10 mg daily. 8. Humulin N 10 units in the evening and 20 units in the morning     subcutaneously. 9. Hydrocodone/APAP 5/325 mg one twice daily as needed. 10.Levothyroxine 50 mcg one in the morning. 11.Metoclopramide 5 mg one before meals and at bedtime. 12.Metoprolol tartrate 50 mg one twice daily. 13.Multivitamin one daily. 14.Omeprazole 40 mg one daily. 15.Simvastatin 20 mg one at bedtime. 16.The patient did discontinue amitriptyline and Cipro.  DISCHARGE DIET:  Low-sodium heart-healthy, 1500 mL fluid restriction, and low-calorie carbohydrate modified diet.  DISCHARGE ACTIVITY:  The patient is to increase activity as tolerated and use walker for ambulation.  FOLLOWUP:  By Dr. Orpah Cobb in 2 weeks.  CONDITION ON DISCHARGE:  Improved.  HISTORY:  This is a 65 year old black female, presented with substernal chest pain.  The patient has a past history of diabetes, hypertension, coronary artery disease, elevated cholesterol, peripheral  vascular disease, and dementia.  PHYSICAL EXAMINATION:  VITAL SIGNS:  Pulse 72, respirations 18, blood pressure 163/75, weight approximately 220 pounds, body mass index of 40, and temperature of 98. HEENT:  The patient is normocephalic, atraumatic, with brown eyes, hazy lens bilaterally, wears glasses.  Conjunctivae are pale and pink. NECK:  No JVD, multiple skin tags.  Full range of motion. LUNGS:  Clear bilaterally. HEART:  Normal S1-S2. ABDOMEN:  Soft, distended, but nontender. EXTREMITIES:  1+ edema.  No cyanosis or clubbing.  A large old scar over the lateral aspect of the left lower leg and evidence of right second toe amputation. CNS:  Bilateral equal grips.  The patient is alert and oriented x1 with significant confusion and occasional crying spells.  LABORATORY DATA:  Hemoglobin low at 9.1, hematocrit 27.5, WBC count 11.8.  CK-MB, troponin I normal, electrolytes normal, BUN 46, creatinine 3.23.  Pro BNP elevated at 2923.  Anemia panel showed a fine low at 31, normal B12 and folate level, hemoglobin A1c elevated at 7.  Nuclear stress test showed a focal distal inferior wall ischemia with mild hypokinesia of the distal inferior wall.  Chest x-ray, no evidence of acute cardiopulmonary home.  EKG showed a normal sinus rhythm with a lateral ischemia.  HOSPITAL COURSE:  The patient was admitted to telemetry unit.  She had normal  cardiac enzymes, but nuclear stress test showed focal inferior wall ischemia.  The patient was scheduled for cardiac catheterization, but had significant confusion on the day testing.  It was decided to treat the patient medically.  Xanax was added to her medications list and the patient's condition somewhat improved, and she was transferred to area nursing home per family and patient's request, and she will be followed by me in 2 weeks.  She was to take iron as ferrous sulfate 325 mg one daily as over-the-counter medication which is not on her  current medication list.     [name], M.D.     ASK/MEDQ  D:  08/07/2011  T:  08/07/2011  Job:  [id number]  Electronically Signed by Orpah Cobb M.D. on 08/11/2011 04:57:29 AM

## 2011-09-15 LAB — BASIC METABOLIC PANEL
BUN: 19
BUN: 23
CO2: 26
CO2: 29
Calcium: 8.6
Calcium: 8.7
Chloride: 105
Chloride: 108
Chloride: 110
Creatinine, Ser: 1.71 — ABNORMAL HIGH
Creatinine, Ser: 1.71 — ABNORMAL HIGH
Creatinine, Ser: 1.77 — ABNORMAL HIGH
GFR calc Af Amer: 35 — ABNORMAL LOW
GFR calc Af Amer: 36 — ABNORMAL LOW
GFR calc Af Amer: 43 — ABNORMAL LOW
GFR calc non Af Amer: 30 — ABNORMAL LOW
GFR calc non Af Amer: 35 — ABNORMAL LOW
Potassium: 4.7
Potassium: 5.2 — ABNORMAL HIGH
Sodium: 141
Sodium: 142

## 2011-09-15 LAB — CBC
HCT: 25 — ABNORMAL LOW
HCT: 27.8 — ABNORMAL LOW
Hemoglobin: 9.4 — ABNORMAL LOW
MCHC: 33.4
MCHC: 33.6
MCV: 88.8
MCV: 89.1
MCV: 89.4
MCV: 89.8
Platelets: 276
Platelets: 310
Platelets: 326
Platelets: 341
RBC: 2.81 — ABNORMAL LOW
RBC: 3.14 — ABNORMAL LOW
RDW: 14.5
WBC: 7.2
WBC: 7.4
WBC: 9.7

## 2011-09-15 LAB — COMPREHENSIVE METABOLIC PANEL
AST: 22
Albumin: 2.8 — ABNORMAL LOW
Chloride: 111
Creatinine, Ser: 1.58 — ABNORMAL HIGH
GFR calc Af Amer: 40 — ABNORMAL LOW
Potassium: 5.9 — ABNORMAL HIGH
Total Bilirubin: 0.4
Total Protein: 6.2

## 2011-09-15 LAB — IRON AND TIBC
Iron: 37 — ABNORMAL LOW
Saturation Ratios: 18 — ABNORMAL LOW
TIBC: 201 — ABNORMAL LOW

## 2011-09-15 LAB — WOUND CULTURE: Gram Stain: NONE SEEN

## 2011-09-15 LAB — DIFFERENTIAL
Basophils Absolute: 0
Eosinophils Relative: 3
Lymphocytes Relative: 16
Monocytes Absolute: 0.6
Monocytes Relative: 6
Neutro Abs: 7.2

## 2011-09-15 LAB — CULTURE, BLOOD (ROUTINE X 2): Culture: NO GROWTH

## 2011-09-16 LAB — BASIC METABOLIC PANEL
BUN: 16
BUN: 17
BUN: 19
BUN: 22
BUN: 24 — ABNORMAL HIGH
CO2: 25
CO2: 29
CO2: 28
CO2: 29
CO2: 29
Calcium: 8.2 — ABNORMAL LOW
Calcium: 8.3 — ABNORMAL LOW
Calcium: 9
Calcium: 8.2 — ABNORMAL LOW
Calcium: 8.5
Calcium: 9.3
Chloride: 102
Chloride: 108
Chloride: 109
Chloride: 115 — ABNORMAL HIGH
Chloride: 100
Chloride: 103
Chloride: 103
Chloride: 104
Chloride: 107
Creatinine, Ser: 1.22 — ABNORMAL HIGH
Creatinine, Ser: 1.28 — ABNORMAL HIGH
Creatinine, Ser: 1.35 — ABNORMAL HIGH
Creatinine, Ser: 1.49 — ABNORMAL HIGH
Creatinine, Ser: 1.71 — ABNORMAL HIGH
Creatinine, Ser: 2.22 — ABNORMAL HIGH
Creatinine, Ser: 2.3 — ABNORMAL HIGH
GFR calc Af Amer: 43 — ABNORMAL LOW
GFR calc Af Amer: 43 — ABNORMAL LOW
GFR calc Af Amer: 48 — ABNORMAL LOW
GFR calc Af Amer: 51 — ABNORMAL LOW
GFR calc Af Amer: 54 — ABNORMAL LOW
GFR calc Af Amer: 26 — ABNORMAL LOW
GFR calc Af Amer: 27 — ABNORMAL LOW
GFR calc Af Amer: 36 — ABNORMAL LOW
GFR calc non Af Amer: 22 — ABNORMAL LOW
GFR calc non Af Amer: 29 — ABNORMAL LOW
GFR calc non Af Amer: 39 — ABNORMAL LOW
Glucose, Bld: 303 — ABNORMAL HIGH
Glucose, Bld: 257 — ABNORMAL HIGH
Glucose, Bld: 329 — ABNORMAL HIGH
Potassium: 4.1
Potassium: 5.1
Potassium: 3.9
Potassium: 4
Potassium: 4.2
Potassium: 4.6
Sodium: 136
Sodium: 141
Sodium: 142
Sodium: 138
Sodium: 138
Sodium: 139
Sodium: 142

## 2011-09-16 LAB — IRON AND TIBC
Iron: 23 — ABNORMAL LOW
Saturation Ratios: 14 — ABNORMAL LOW
UIBC: 152
UIBC: 163

## 2011-09-16 LAB — BASIC METABOLIC PANEL WITH GFR
BUN: 20
BUN: 29 — ABNORMAL HIGH
BUN: 31 — ABNORMAL HIGH
BUN: 34 — ABNORMAL HIGH
CO2: 28
CO2: 28
CO2: 30
CO2: 31
Calcium: 8.2 — ABNORMAL LOW
Calcium: 8.5
Calcium: 8.8
Calcium: 8.9
Chloride: 101
Chloride: 102
Chloride: 104
Chloride: 106
Creatinine, Ser: 1.88 — ABNORMAL HIGH
Creatinine, Ser: 1.91 — ABNORMAL HIGH
Creatinine, Ser: 2.08 — ABNORMAL HIGH
Creatinine, Ser: 2.26 — ABNORMAL HIGH
GFR calc non Af Amer: 22 — ABNORMAL LOW
GFR calc non Af Amer: 24 — ABNORMAL LOW
GFR calc non Af Amer: 27 — ABNORMAL LOW
GFR calc non Af Amer: 27 — ABNORMAL LOW
Glucose, Bld: 103 — ABNORMAL HIGH
Glucose, Bld: 217 — ABNORMAL HIGH
Glucose, Bld: 72
Glucose, Bld: 76
Potassium: 3.9
Potassium: 4
Potassium: 4.1
Potassium: 4.2
Sodium: 137
Sodium: 138
Sodium: 140
Sodium: 142

## 2011-09-16 LAB — CARDIAC PANEL(CRET KIN+CKTOT+MB+TROPI)
CK, MB: 1.4
CK, MB: 1.5
Relative Index: INVALID
Total CK: 113
Total CK: 73
Total CK: 81

## 2011-09-16 LAB — B-NATRIURETIC PEPTIDE (CONVERTED LAB)
Pro B Natriuretic peptide (BNP): 256 — ABNORMAL HIGH
Pro B Natriuretic peptide (BNP): 118 — ABNORMAL HIGH
Pro B Natriuretic peptide (BNP): 224 — ABNORMAL HIGH
Pro B Natriuretic peptide (BNP): 66.3
Pro B Natriuretic peptide (BNP): 68.5

## 2011-09-16 LAB — CROSSMATCH
ABO/RH(D): B NEG
ABO/RH(D): B NEG
Antibody Screen: NEGATIVE

## 2011-09-16 LAB — CBC
HCT: 22.4 — ABNORMAL LOW
HCT: 24.9 — ABNORMAL LOW
HCT: 22.8 — ABNORMAL LOW
HCT: 25.7 — ABNORMAL LOW
HCT: 25.7 — ABNORMAL LOW
HCT: 25.9 — ABNORMAL LOW
HCT: 26.5 — ABNORMAL LOW
HCT: 27.8 — ABNORMAL LOW
HCT: 28.5 — ABNORMAL LOW
HCT: 30.1 — ABNORMAL LOW
HCT: 30.8 — ABNORMAL LOW
Hemoglobin: 7.6 — CL
Hemoglobin: 8.3 — ABNORMAL LOW
Hemoglobin: 8.6 — ABNORMAL LOW
Hemoglobin: 10 — ABNORMAL LOW
Hemoglobin: 10.3 — ABNORMAL LOW
Hemoglobin: 7.9 — CL
Hemoglobin: 8.5 — ABNORMAL LOW
Hemoglobin: 8.9 — ABNORMAL LOW
Hemoglobin: 9 — ABNORMAL LOW
Hemoglobin: 9.4 — ABNORMAL LOW
Hemoglobin: 9.7 — ABNORMAL LOW
MCHC: 33
MCHC: 33.8
MCHC: 34
MCHC: 34.4
MCHC: 32.6
MCHC: 33.6
MCHC: 33.9
MCHC: 34.1
MCHC: 34.1
MCHC: 34.2
MCHC: 34.2
MCHC: 34.8
MCV: 87.5
MCV: 88.1
MCV: 88.2
MCV: 85.2
MCV: 86
MCV: 86.5
MCV: 86.6
MCV: 86.8
MCV: 87.3
MCV: 87.4
MCV: 87.4
MCV: 88.4
MCV: 88.7
Platelets: 400
Platelets: 483 — ABNORMAL HIGH
Platelets: 383
Platelets: 385
Platelets: 400
Platelets: 404 — ABNORMAL HIGH
Platelets: 405 — ABNORMAL HIGH
Platelets: 426 — ABNORMAL HIGH
Platelets: 437 — ABNORMAL HIGH
Platelets: 485 — ABNORMAL HIGH
Platelets: 585 — ABNORMAL HIGH
RBC: 2.54 — ABNORMAL LOW
RBC: 2.8 — ABNORMAL LOW
RBC: 2.85 — ABNORMAL LOW
RBC: 2.98 — ABNORMAL LOW
RBC: 3.06 — ABNORMAL LOW
RBC: 2.57 — ABNORMAL LOW
RBC: 2.88 — ABNORMAL LOW
RBC: 3 — ABNORMAL LOW
RBC: 3.03 — ABNORMAL LOW
RBC: 3.05 — ABNORMAL LOW
RBC: 3.14 — ABNORMAL LOW
RBC: 3.35 — ABNORMAL LOW
RBC: 3.47 — ABNORMAL LOW
RBC: 3.52 — ABNORMAL LOW
RDW: 15
RDW: 15.1
RDW: 15.9 — ABNORMAL HIGH
RDW: 16.1 — ABNORMAL HIGH
RDW: 16.2 — ABNORMAL HIGH
RDW: 16.2 — ABNORMAL HIGH
RDW: 16.2 — ABNORMAL HIGH
RDW: 16.7 — ABNORMAL HIGH
RDW: 16.8 — ABNORMAL HIGH
WBC: 10.6 — ABNORMAL HIGH
WBC: 11.2 — ABNORMAL HIGH
WBC: 9.1
WBC: 9.6
WBC: 11 — ABNORMAL HIGH
WBC: 12.3 — ABNORMAL HIGH
WBC: 7.5
WBC: 8.1
WBC: 8.2
WBC: 8.3
WBC: 8.6
WBC: 8.6
WBC: 9.5
WBC: 9.6
WBC: 9.8

## 2011-09-16 LAB — COMPREHENSIVE METABOLIC PANEL
AST: 19
AST: 20
CO2: 26
CO2: 30
Calcium: 8 — ABNORMAL LOW
Chloride: 103
Creatinine, Ser: 1.46 — ABNORMAL HIGH
Creatinine, Ser: 1.63 — ABNORMAL HIGH
GFR calc Af Amer: 39 — ABNORMAL LOW
GFR calc Af Amer: 44 — ABNORMAL LOW
GFR calc non Af Amer: 32 — ABNORMAL LOW
GFR calc non Af Amer: 36 — ABNORMAL LOW
Glucose, Bld: 149 — ABNORMAL HIGH
Glucose, Bld: 384 — ABNORMAL HIGH
Sodium: 135
Total Bilirubin: 0.4
Total Protein: 5.3 — ABNORMAL LOW

## 2011-09-16 LAB — PROTIME-INR
INR: 1
Prothrombin Time: 13.8

## 2011-09-16 LAB — DIFFERENTIAL
Basophils Absolute: 0
Basophils Relative: 0
Basophils Relative: 1
Eosinophils Absolute: 0.2
Eosinophils Absolute: 0.2
Eosinophils Relative: 1
Lymphocytes Relative: 15
Lymphs Abs: 1.6
Lymphs Abs: 1.4
Monocytes Absolute: 0.9
Monocytes Relative: 8
Neutro Abs: 8.6 — ABNORMAL HIGH
Neutro Abs: 7.2
Neutro Abs: 9.6 — ABNORMAL HIGH
Neutrophils Relative %: 76
Neutrophils Relative %: 76
Neutrophils Relative %: 78 — ABNORMAL HIGH

## 2011-09-16 LAB — H. PYLORI ANTIBODY, IGG: H Pylori IgG: <0.4

## 2011-09-16 LAB — HEMOGLOBIN A1C
Hgb A1c MFr Bld: 9 — ABNORMAL HIGH
Mean Plasma Glucose: 243

## 2011-09-16 LAB — POCT CARDIAC MARKERS
Operator id: 4001
Troponin i, poc: <0.05

## 2011-09-16 LAB — FERRITIN: Ferritin: 36 (ref 10–291)

## 2011-09-16 LAB — ABO/RH: ABO/RH(D): B NEG

## 2011-09-16 LAB — D-DIMER, QUANTITATIVE: D-Dimer, Quant: 1.8 — ABNORMAL HIGH

## 2011-09-17 ENCOUNTER — Emergency Department (HOSPITAL_COMMUNITY): Payer: Medicare Other

## 2011-09-17 ENCOUNTER — Emergency Department (HOSPITAL_COMMUNITY)
Admission: EM | Admit: 2011-09-17 | Discharge: 2011-09-18 | Disposition: A | Discharge status: Home / Self Care | Payer: Medicare Other | Attending: Emergency Medicine | Admitting: Emergency Medicine

## 2011-09-17 DIAGNOSIS — E119 Type 2 diabetes mellitus without complications: Secondary | ICD-10-CM | POA: Insufficient documentation

## 2011-09-17 DIAGNOSIS — Z79899 Other long term (current) drug therapy: Secondary | ICD-10-CM | POA: Insufficient documentation

## 2011-09-17 DIAGNOSIS — F0390 Unspecified dementia without behavioral disturbance: Secondary | ICD-10-CM | POA: Insufficient documentation

## 2011-09-17 DIAGNOSIS — R51 Headache: Secondary | ICD-10-CM | POA: Insufficient documentation

## 2011-09-17 DIAGNOSIS — Z794 Long term (current) use of insulin: Secondary | ICD-10-CM | POA: Insufficient documentation

## 2011-09-17 DIAGNOSIS — K458 Other specified abdominal hernia without obstruction or gangrene: Secondary | ICD-10-CM | POA: Insufficient documentation

## 2011-09-17 DIAGNOSIS — I10 Essential (primary) hypertension: Secondary | ICD-10-CM | POA: Insufficient documentation

## 2011-09-17 LAB — URINALYSIS, ROUTINE W REFLEX MICROSCOPIC
Ketones, ur: NEGATIVE mg/dL
Nitrite: NEGATIVE
Specific Gravity, Urine: 1.01 (ref 1.005–1.030)
Urobilinogen, UA: 0.2 mg/dL (ref 0.0–1.0)
pH: 6 (ref 5.0–8.0)

## 2011-09-17 LAB — DIFFERENTIAL
Eosinophils Relative: 3 % (ref 0–5)
Lymphocytes Relative: 28 % (ref 12–46)
Lymphs Abs: 2.5 10*3/uL (ref 0.7–4.0)
Monocytes Absolute: 0.8 10*3/uL (ref 0.1–1.0)
Monocytes Relative: 9 % (ref 3–12)

## 2011-09-17 LAB — COMPREHENSIVE METABOLIC PANEL
Albumin: 3.1 g/dL — ABNORMAL LOW (ref 3.5–5.2)
BUN: 57 mg/dL — ABNORMAL HIGH (ref 6–23)
Creatinine, Ser: 3.21 mg/dL — ABNORMAL HIGH (ref 0.50–1.10)
Total Bilirubin: 0.1 mg/dL — ABNORMAL LOW (ref 0.3–1.2)
Total Protein: 7 g/dL (ref 6.0–8.3)

## 2011-09-17 LAB — CBC
HCT: 23.4 % — ABNORMAL LOW (ref 36.0–46.0)
MCHC: 32.9 g/dL (ref 30.0–36.0)
MCV: 87.3 fL (ref 78.0–100.0)
RDW: 14.9 % (ref 11.5–15.5)

## 2011-09-17 LAB — LIPASE, BLOOD: Lipase: 27 U/L (ref 11–59)

## 2011-09-17 LAB — URINE MICROSCOPIC-ADD ON

## 2011-09-24 LAB — POCT I-STAT, CHEM 8
Chloride: 107
Glucose, Bld: 210 — ABNORMAL HIGH
HCT: 27 — ABNORMAL LOW
Potassium: 4.1
Sodium: 138

## 2011-09-24 LAB — POCT CARDIAC MARKERS
CKMB, poc: 1.1
Myoglobin, poc: 164
Troponin i, poc: <0.05

## 2011-09-24 LAB — GLUCOSE, CAPILLARY

## 2011-09-24 LAB — D-DIMER, QUANTITATIVE: D-Dimer, Quant: 1.4 — ABNORMAL HIGH

## 2011-10-09 LAB — COMPREHENSIVE METABOLIC PANEL
ALT: 20
Alkaline Phosphatase: 148 — ABNORMAL HIGH
CO2: 31
GFR calc non Af Amer: 35 — ABNORMAL LOW
Glucose, Bld: 162 — ABNORMAL HIGH
Potassium: 4.9
Sodium: 137

## 2011-10-09 LAB — BASIC METABOLIC PANEL
BUN: 25 — ABNORMAL HIGH
BUN: 30 — ABNORMAL HIGH
Calcium: 8.5
Calcium: 8.7
Creatinine, Ser: 1.53 — ABNORMAL HIGH
GFR calc Af Amer: 43 — ABNORMAL LOW
GFR calc non Af Amer: 34 — ABNORMAL LOW
GFR calc non Af Amer: 35 — ABNORMAL LOW
Glucose, Bld: 139 — ABNORMAL HIGH
Sodium: 139

## 2011-10-09 LAB — DIFFERENTIAL
Basophils Relative: 1
Eosinophils Absolute: 0.4
Eosinophils Relative: 4
Monocytes Relative: 8
Neutrophils Relative %: 63

## 2011-10-09 LAB — CULTURE, BLOOD (ROUTINE X 2)

## 2011-10-09 LAB — CARDIAC PANEL(CRET KIN+CKTOT+MB+TROPI)
CK, MB: 1.9
Relative Index: 1.2
Total CK: 116
Total CK: 161
Troponin I: 0.01
Troponin I: 0.02

## 2011-10-09 LAB — CBC
Hemoglobin: 9.4 — ABNORMAL LOW
RBC: 3.31 — ABNORMAL LOW
WBC: 10.8 — ABNORMAL HIGH

## 2011-10-09 LAB — PROTIME-INR: INR: 1

## 2011-10-09 LAB — HEMOGLOBIN A1C: Hgb A1c MFr Bld: 8.7 — ABNORMAL HIGH

## 2011-10-09 LAB — B-NATRIURETIC PEPTIDE (CONVERTED LAB): Pro B Natriuretic peptide (BNP): 137 — ABNORMAL HIGH

## 2012-04-20 ENCOUNTER — Other Ambulatory Visit: Payer: Self-pay | Admitting: Cardiovascular Disease

## 2012-04-20 ENCOUNTER — Ambulatory Visit
Admission: RE | Admit: 2012-04-20 | Discharge: 2012-04-20 | Disposition: A | Discharge status: Home / Self Care | Payer: Medicare Other | Source: Ambulatory Visit | Attending: Cardiovascular Disease | Admitting: Cardiovascular Disease

## 2012-04-20 DIAGNOSIS — M25442 Effusion, left hand: Secondary | ICD-10-CM

## 2012-04-21 ENCOUNTER — Encounter (HOSPITAL_BASED_OUTPATIENT_CLINIC_OR_DEPARTMENT_OTHER): Payer: Medicare Other | Attending: Plastic Surgery

## 2012-04-21 DIAGNOSIS — J449 Chronic obstructive pulmonary disease, unspecified: Secondary | ICD-10-CM | POA: Insufficient documentation

## 2012-04-21 DIAGNOSIS — L97809 Non-pressure chronic ulcer of other part of unspecified lower leg with unspecified severity: Secondary | ICD-10-CM | POA: Insufficient documentation

## 2012-04-21 DIAGNOSIS — J4489 Other specified chronic obstructive pulmonary disease: Secondary | ICD-10-CM | POA: Insufficient documentation

## 2012-04-21 DIAGNOSIS — Z79899 Other long term (current) drug therapy: Secondary | ICD-10-CM | POA: Insufficient documentation

## 2012-04-21 DIAGNOSIS — I10 Essential (primary) hypertension: Secondary | ICD-10-CM | POA: Insufficient documentation

## 2012-04-21 DIAGNOSIS — E119 Type 2 diabetes mellitus without complications: Secondary | ICD-10-CM | POA: Insufficient documentation

## 2012-04-21 DIAGNOSIS — I890 Lymphedema, not elsewhere classified: Secondary | ICD-10-CM | POA: Insufficient documentation

## 2012-04-21 DIAGNOSIS — M7989 Other specified soft tissue disorders: Secondary | ICD-10-CM | POA: Insufficient documentation

## 2012-04-21 DIAGNOSIS — S6990XA Unspecified injury of unspecified wrist, hand and finger(s), initial encounter: Secondary | ICD-10-CM | POA: Insufficient documentation

## 2012-04-21 DIAGNOSIS — X58XXXA Exposure to other specified factors, initial encounter: Secondary | ICD-10-CM | POA: Insufficient documentation

## 2012-04-21 NOTE — Progress Notes (Signed)
[facility]  NAMEMarland Kitchen  [name], [name]            ACCOUNT NO.:  [id number]  MEDICAL RECORD NO.:  [id number]      DATE OF BIRTH:  1946/12/22  PHYSICIAN:  Wayland Denis, DO       VISIT DATE:  04/21/2012                                  OFFICE VISIT   CHIEF COMPLAINT:  Lower extremity wound and upper extremity wound.  HISTORY OF PRESENT ILLNESS:  The patient is a 66 year old female with multiple medical conditions including diabetes who presents for evaluation of multiple wounds.  She has one on the right anterior foot, one on the left lateral leg and on the left middle finger.  She has been dealing with these for several weeks now without much improvement, nothing seems to make it better and sometime it is creating more pain and more of a wound.  PAST MEDICAL HISTORY:  Positive for: 1. Diabetes. 2. Hypertension. 3. COPD. 4. Neuropathy. 5. Obesity. 6. Gout. 7. Thyroid disease. 8. Ankle fracture.  She denies having had any surgeries.  MEDICATIONS:  Include Synthroid, Nexium, Lasix, Glucotrol, metoprolol, clonidine, simvastatin, Levemir, Elavil, hydrocodone, metoclopramide, Colace, and multivitamin.  ALLERGIES:  PENICILLIN.  REVIEW OF SYSTEMS:  Poor blood sugar control, otherwise related to her history of present illness.  SOCIAL HISTORY:  She lives at home.  PHYSICAL EXAMINATION:  She is alert, oriented, cooperative, not in any acute distress.  She is a little bit confused.  Her son gives some of her history.  Her pupils are equal.  Extraocular muscles are intact.  No cervical lymphadenopathy.  Her breathing is unlabored at rest.  Her heart is regular.  Her abdomen is large, but soft.  She has multiple wounds including left lower extremity, right lower extremity, and left- to-middle finger with swelling.  She has full range of motion of her hand and fingers with good capillary refill, but it is swollen and tender.  RECOMMEND:  SilverCel to all  areas and if her finger gets anymore swollen, tender, or inability to close her fist, then she needs to go to the operating room and this was described to her son as well.  We will see her back in 1 week.     Wayland Denis, DO     CS/MEDQ  D:  04/21/2012  T:  04/21/2012  Job:  [id number]

## 2012-05-07 ENCOUNTER — Other Ambulatory Visit: Payer: Self-pay | Admitting: Orthopedic Surgery

## 2012-05-07 NOTE — Progress Notes (Signed)
[facility]  NAME:  [name], [name]                 ACCOUNT NO.:  MEDICAL RECORD NO.:  [id number]      DATE OF BIRTH:  01/31/1946  PHYSICIAN:  Wayland Denis, DO       VISIT DATE:  05/05/2012                                  OFFICE VISIT   The patient is a 66 year old female who is here for followup on her lower extremity ulcers.  She also has a wound of her left middle finger. We have been doing Silvercel on the lower extremity and triple antibiotics on the finger.  The lower extremity ulcers are quite improved.  She still has a lot of swelling and lymphedema.  She has not been elevating them as much as she knows that she should, but there does not appear to be any sign of infection in the lower extremities.  Her finger on the other hand is more swollen, purple, tender, and does not have the range of motion that it had with flexion from last week.  There is also a wound noted on the DIP dorsal aspect of the left middle finger.  There has been no change in her medications or social history.  On exam, she is alert and oriented.  Her pupils are equal.  Her breathing is unlabored.  Her heart is regular.  The wounds are described above.  We will continue with Silvercel in the lower extremities, triple-antibiotic on the finger, but I will send her to a consult for Hand Surgery as I think at this point, she is going to need debridement or drainage of the finger.     Wayland Denis, DO     CS/MEDQ  D:  05/05/2012  T:  05/05/2012  Job:  [id number]

## 2012-05-13 ENCOUNTER — Encounter (HOSPITAL_COMMUNITY): Payer: Self-pay | Admitting: *Deleted

## 2012-05-13 MED ORDER — CEFAZOLIN SODIUM 1-5 GM-% IV SOLN
1.0000 g | INTRAVENOUS | Status: DC
  Filled 2012-05-13: qty 50

## 2012-05-13 MED ORDER — CHLORHEXIDINE GLUCONATE 4 % EX LIQD: 60.0000 mL | Freq: Once | CUTANEOUS | Status: DC

## 2012-05-14 ENCOUNTER — Encounter (HOSPITAL_COMMUNITY): Payer: Self-pay | Admitting: Anesthesiology

## 2012-05-14 ENCOUNTER — Ambulatory Visit (HOSPITAL_COMMUNITY): Payer: Medicare Other | Admitting: Anesthesiology

## 2012-05-14 ENCOUNTER — Encounter (HOSPITAL_COMMUNITY)
Admission: RE | Disposition: A | Discharge status: Home / Self Care | Payer: Self-pay | Source: Ambulatory Visit | Attending: Orthopedic Surgery

## 2012-05-14 ENCOUNTER — Encounter (HOSPITAL_COMMUNITY): Payer: Self-pay | Admitting: Surgery

## 2012-05-14 ENCOUNTER — Encounter (HOSPITAL_COMMUNITY): Payer: Self-pay | Admitting: Pharmacy Technician

## 2012-05-14 ENCOUNTER — Ambulatory Visit (HOSPITAL_COMMUNITY)
Admission: RE | Admit: 2012-05-14 | Discharge: 2012-05-14 | Disposition: A | Discharge status: Home / Self Care | Payer: Medicare Other | Source: Ambulatory Visit | Attending: Orthopedic Surgery | Admitting: Orthopedic Surgery

## 2012-05-14 DIAGNOSIS — X58XXXA Exposure to other specified factors, initial encounter: Secondary | ICD-10-CM | POA: Insufficient documentation

## 2012-05-14 DIAGNOSIS — S62639B Displaced fracture of distal phalanx of unspecified finger, initial encounter for open fracture: Secondary | ICD-10-CM | POA: Insufficient documentation

## 2012-05-14 DIAGNOSIS — M869 Osteomyelitis, unspecified: Secondary | ICD-10-CM

## 2012-05-14 DIAGNOSIS — S61209A Unspecified open wound of unspecified finger without damage to nail, initial encounter: Secondary | ICD-10-CM | POA: Insufficient documentation

## 2012-05-14 HISTORY — DX: Unspecified osteoarthritis, unspecified site: M19.90

## 2012-05-14 HISTORY — PX: I & D EXTREMITY: SHX5045

## 2012-05-14 HISTORY — DX: Essential (primary) hypertension: I10

## 2012-05-14 HISTORY — PX: ORIF FINGER FRACTURE: SHX2122

## 2012-05-14 LAB — BASIC METABOLIC PANEL
BUN: 48 mg/dL — ABNORMAL HIGH (ref 6–23)
CO2: 18 mEq/L — ABNORMAL LOW (ref 19–32)
Calcium: 9.3 mg/dL (ref 8.4–10.5)
Chloride: 112 mEq/L (ref 96–112)
Creatinine, Ser: 4.51 mg/dL — ABNORMAL HIGH (ref 0.50–1.10)
GFR calc Af Amer: 11 mL/min — ABNORMAL LOW (ref >90)
GFR calc non Af Amer: 9 mL/min — ABNORMAL LOW (ref >90)
Glucose, Bld: 215 mg/dL — ABNORMAL HIGH (ref 70–99)
Potassium: 4 mEq/L (ref 3.5–5.1)
Sodium: 146 mEq/L — ABNORMAL HIGH (ref 135–145)

## 2012-05-14 LAB — GRAM STAIN

## 2012-05-14 LAB — CBC
HCT: 27.9 % — ABNORMAL LOW (ref 36.0–46.0)
Hemoglobin: 9.1 g/dL — ABNORMAL LOW (ref 12.0–15.0)
MCH: 28.3 pg (ref 26.0–34.0)
MCHC: 32.6 g/dL (ref 30.0–36.0)
MCV: 86.9 fL (ref 78.0–100.0)
Platelets: 225 10*3/uL (ref 150–400)
RBC: 3.21 MIL/uL — ABNORMAL LOW (ref 3.87–5.11)
RDW: 14.9 % (ref 11.5–15.5)
WBC: 7.6 10*3/uL (ref 4.0–10.5)

## 2012-05-14 LAB — GLUCOSE, CAPILLARY
Glucose-Capillary: 195 mg/dL — ABNORMAL HIGH (ref 70–99)
Glucose-Capillary: 194 mg/dL — ABNORMAL HIGH (ref 70–99)

## 2012-05-14 LAB — SURGICAL PCR SCREEN
MRSA, PCR: NEGATIVE
Staphylococcus aureus: NEGATIVE

## 2012-05-14 SURGERY — IRRIGATION AND DEBRIDEMENT EXTREMITY
Anesthesia: General | Site: Finger | Laterality: Left | Wound class: Dirty or Infected

## 2012-05-14 MED ORDER — CIPROFLOXACIN HCL 500 MG PO TABS: 500.0000 mg | ORAL_TABLET | Freq: Two times a day (BID) | ORAL | Status: AC

## 2012-05-14 MED ORDER — CIPROFLOXACIN IN D5W 400 MG/200ML IV SOLN
INTRAVENOUS | Status: AC
  Filled 2012-05-14: qty 200

## 2012-05-14 MED ORDER — HYDROMORPHONE HCL PF 1 MG/ML IJ SOLN: 0.2500 mg | INTRAMUSCULAR | Status: DC | PRN

## 2012-05-14 MED ORDER — METOPROLOL TARTRATE 12.5 MG HALF TABLET
ORAL_TABLET | ORAL | Status: AC
  Filled 2012-05-14: qty 2

## 2012-05-14 MED ORDER — MUPIROCIN 2 % EX OINT
TOPICAL_OINTMENT | Freq: Two times a day (BID) | CUTANEOUS | Status: DC
  Administered 2012-05-14: 1 via NASAL
  Filled 2012-05-14: qty 22

## 2012-05-14 MED ORDER — BUPIVACAINE HCL (PF) 0.25 % IJ SOLN
INTRAMUSCULAR | Status: DC | PRN
  Administered 2012-05-14: 5 mL

## 2012-05-14 MED ORDER — FENTANYL CITRATE 0.05 MG/ML IJ SOLN
INTRAMUSCULAR | Status: DC | PRN
  Administered 2012-05-14: 100 ug via INTRAVENOUS

## 2012-05-14 MED ORDER — OXYCODONE-ACETAMINOPHEN 5-325 MG PO TABS: 1.0000 | ORAL_TABLET | ORAL | Status: AC | PRN

## 2012-05-14 MED ORDER — PROPOFOL 10 MG/ML IV EMUL
INTRAVENOUS | Status: DC | PRN
  Administered 2012-05-14: 130 mg via INTRAVENOUS

## 2012-05-14 MED ORDER — DROPERIDOL 2.5 MG/ML IJ SOLN: 0.6250 mg | INTRAMUSCULAR | Status: DC | PRN

## 2012-05-14 MED ORDER — ONDANSETRON HCL 4 MG/2ML IJ SOLN
INTRAMUSCULAR | Status: DC | PRN
  Administered 2012-05-14: 4 mg via INTRAVENOUS

## 2012-05-14 MED ORDER — LACTATED RINGERS IV SOLN
INTRAVENOUS | Status: DC
  Administered 2012-05-14: 14:00:00 via INTRAVENOUS

## 2012-05-14 MED ORDER — LACTATED RINGERS IV SOLN
INTRAVENOUS | Status: DC | PRN
  Administered 2012-05-14: 14:00:00 via INTRAVENOUS

## 2012-05-14 MED ORDER — METOPROLOL TARTRATE 25 MG PO TABS
25.0000 mg | ORAL_TABLET | Freq: Once | ORAL | Status: AC
  Administered 2012-05-14: 25 mg via ORAL

## 2012-05-14 MED ORDER — SODIUM CHLORIDE 0.9 % IR SOLN
Status: DC | PRN
  Administered 2012-05-14: 1000 mL

## 2012-05-14 MED ORDER — CIPROFLOXACIN IN D5W 400 MG/200ML IV SOLN
INTRAVENOUS | Status: DC | PRN
  Administered 2012-05-14: 400 mg via INTRAVENOUS

## 2012-05-14 MED ORDER — MUPIROCIN 2 % EX OINT
TOPICAL_OINTMENT | CUTANEOUS | Status: AC
  Administered 2012-05-14: 1 via NASAL
  Filled 2012-05-14: qty 22

## 2012-05-14 SURGICAL SUPPLY — 74 items
BANDAGE CONFORM 2  STR LF (GAUZE/BANDAGES/DRESSINGS) IMPLANT
BANDAGE ELASTIC 3 VELCRO ST LF (GAUZE/BANDAGES/DRESSINGS) ×1 IMPLANT
BANDAGE ELASTIC 4 VELCRO ST LF (GAUZE/BANDAGES/DRESSINGS) ×1 IMPLANT
BANDAGE GAUZE ELAST BULKY 4 IN (GAUZE/BANDAGES/DRESSINGS) ×2 IMPLANT
BNDG ADH 5X3 H2O RPLNT NS (GAUZE/BANDAGES/DRESSINGS)
BNDG CMPR 9X4 STRL LF SNTH (GAUZE/BANDAGES/DRESSINGS) ×1
BNDG COHESIVE 1X5 TAN STRL LF (GAUZE/BANDAGES/DRESSINGS) ×2 IMPLANT
BNDG COHESIVE 3X5 WHT NS (GAUZE/BANDAGES/DRESSINGS) ×2 IMPLANT
BNDG ESMARK 4X9 LF (GAUZE/BANDAGES/DRESSINGS) ×2 IMPLANT
CLOTH BEACON ORANGE TIMEOUT ST (SAFETY) ×2 IMPLANT
CORDS BIPOLAR (ELECTRODE) ×1 IMPLANT
COVER SURGICAL LIGHT HANDLE (MISCELLANEOUS) ×4 IMPLANT
CUFF TOURNIQUET SINGLE 18IN (TOURNIQUET CUFF) ×2 IMPLANT
CUFF TOURNIQUET SINGLE 24IN (TOURNIQUET CUFF) IMPLANT
DECANTER SPIKE VIAL GLASS SM (MISCELLANEOUS) ×2 IMPLANT
DRAIN PENROSE 1/4X12 LTX STRL (WOUND CARE) IMPLANT
DRAPE OEC MINIVIEW 54X84 (DRAPES) IMPLANT
DRAPE SURG 17X23 STRL (DRAPES) ×2 IMPLANT
DRSG PAD ABDOMINAL 8X10 ST (GAUZE/BANDAGES/DRESSINGS) ×2 IMPLANT
DURAPREP 26ML APPLICATOR (WOUND CARE) ×1 IMPLANT
ELECT REM PT RETURN 9FT ADLT (ELECTROSURGICAL)
ELECTRODE REM PT RTRN 9FT ADLT (ELECTROSURGICAL) IMPLANT
GAUZE PACKING IODOFORM 1/4X5 (PACKING) IMPLANT
GAUZE SPONGE 2X2 8PLY STRL LF (GAUZE/BANDAGES/DRESSINGS) IMPLANT
GAUZE XEROFORM 1X8 LF (GAUZE/BANDAGES/DRESSINGS) ×2 IMPLANT
GLOVE BIO SURGEON STRL SZ8.5 (GLOVE) ×2 IMPLANT
GLOVE BIOGEL PI ORTHO PRO SZ7 (GLOVE) ×1
GLOVE PI ORTHO PRO STRL SZ7 (GLOVE) IMPLANT
GOWN SRG XL XLNG 56XLVL 4 (GOWN DISPOSABLE) ×1 IMPLANT
GOWN STRL NON-REIN LRG LVL3 (GOWN DISPOSABLE) ×1 IMPLANT
GOWN STRL NON-REIN XL XLG LVL4 (GOWN DISPOSABLE) ×2
GOWN STRL REIN XL XLG (GOWN DISPOSABLE) ×1 IMPLANT
HANDPIECE INTERPULSE COAX TIP (DISPOSABLE)
KIT BASIN OR (CUSTOM PROCEDURE TRAY) ×2 IMPLANT
KIT ROOM TURNOVER OR (KITS) ×2 IMPLANT
MANIFOLD NEPTUNE II (INSTRUMENTS) ×1 IMPLANT
NDL HYPO 25GX1X1/2 BEV (NEEDLE) IMPLANT
NDL HYPO 25X1 1.5 SAFETY (NEEDLE) ×1 IMPLANT
NEEDLE HYPO 25GX1X1/2 BEV (NEEDLE) IMPLANT
NEEDLE HYPO 25X1 1.5 SAFETY (NEEDLE) ×2 IMPLANT
NS IRRIG 1000ML POUR BTL (IV SOLUTION) ×2 IMPLANT
PACK ORTHO EXTREMITY (CUSTOM PROCEDURE TRAY) ×2 IMPLANT
PAD ARMBOARD 7.5X6 YLW CONV (MISCELLANEOUS) ×4 IMPLANT
PAD CAST 3X4 CTTN HI CHSV (CAST SUPPLIES) ×2 IMPLANT
PAD CAST 4YDX4 CTTN HI CHSV (CAST SUPPLIES) ×2 IMPLANT
PADDING CAST COTTON 3X4 STRL (CAST SUPPLIES)
PADDING CAST COTTON 4X4 STRL (CAST SUPPLIES)
SCRUB BETADINE 4OZ XXX (MISCELLANEOUS) ×1 IMPLANT
SET HNDPC FAN SPRY TIP SCT (DISPOSABLE) IMPLANT
SOLUTION BETADINE 4OZ (MISCELLANEOUS) ×1 IMPLANT
SPONGE GAUZE 2X2 STER 10/PKG (GAUZE/BANDAGES/DRESSINGS)
SPONGE GAUZE 4X4 12PLY (GAUZE/BANDAGES/DRESSINGS) ×3 IMPLANT
SPONGE LAP 18X18 X RAY DECT (DISPOSABLE) ×2 IMPLANT
STRIP CLOSURE SKIN 1/2X4 (GAUZE/BANDAGES/DRESSINGS) IMPLANT
SUCTION FRAZIER TIP 10 FR DISP (SUCTIONS) ×2 IMPLANT
SUT ETHILON 4 0 PS 2 18 (SUTURE) IMPLANT
SUT ETHILON 5 0 PS 2 18 (SUTURE) IMPLANT
SUT PROLENE 3 0 PS 2 (SUTURE) IMPLANT
SUT VIC AB 3-0 FS2 27 (SUTURE) IMPLANT
SUT VIC AB 4-0 P-3 18X BRD (SUTURE) IMPLANT
SUT VIC AB 4-0 P3 18 (SUTURE)
SUT VICRYL RAPIDE 4/0 PS 2 (SUTURE) ×1 IMPLANT
SWAB COLLECTION DEVICE MRSA (MISCELLANEOUS) ×1 IMPLANT
SYR 20CC LL (SYRINGE) IMPLANT
SYR CONTROL 10ML LL (SYRINGE) ×2 IMPLANT
TOWEL OR 17X24 6PK STRL BLUE (TOWEL DISPOSABLE) ×2 IMPLANT
TOWEL OR 17X26 10 PK STRL BLUE (TOWEL DISPOSABLE) ×2 IMPLANT
TUBE ANAEROBIC SPECIMEN COL (MISCELLANEOUS) ×1 IMPLANT
TUBE CONNECTING 12X1/4 (SUCTIONS) ×2 IMPLANT
UNDERPAD 30X30 INCONTINENT (UNDERPADS AND DIAPERS) ×2 IMPLANT
WATER STERILE IRR 1000ML POUR (IV SOLUTION) ×1 IMPLANT
YANKAUER SUCT BULB TIP NO VENT (SUCTIONS) ×2 IMPLANT
k wire 4x0.35 (Orthopedic Implant) ×1 IMPLANT
k-wire 4x0.45 (Orthopedic Implant) ×1 IMPLANT

## 2012-05-14 NOTE — Op Note (Signed)
See dictated note [id number]

## 2012-05-14 NOTE — Transfer of Care (Signed)
Immediate Anesthesia Transfer of Care Note  Patient: [name]  Procedure(s) Performed: Procedure(s) (LRB): IRRIGATION AND DEBRIDEMENT EXTREMITY (Left) OPEN REDUCTION INTERNAL FIXATION (ORIF) METACARPAL (FINGER) FRACTURE (Left)  Patient Location: PACU  Anesthesia Type: General  Level of Consciousness: sedated  Airway & Oxygen Therapy: Patient Spontanous Breathing and Patient connected to nasal cannula oxygen  Post-op Assessment: Report given to PACU RN  Post vital signs: Reviewed  Complications: No apparent anesthesia complications

## 2012-05-14 NOTE — Brief Op Note (Signed)
05/14/2012  2:37 PM  PATIENT:  [name]  66 y.o. female  PRE-OPERATIVE DIAGNOSIS:  LEFT LONG P2 FRACTURE WITH INFECTION  POST-OPERATIVE DIAGNOSIS:  * No post-op diagnosis entered *  PROCEDURE:  Procedure(s) (LRB): IRRIGATION AND DEBRIDEMENT EXTREMITY (Left) OPEN REDUCTION INTERNAL FIXATION (ORIF) METACARPAL (FINGER) FRACTURE (Left)  SURGEON:  Surgeon(s) and Role:    * Marlowe Shores, MD - Primary  PHYSICIAN ASSISTANT:   ASSISTANTS: none   ANESTHESIA:   general  EBL:  Total I/O In: 300 [I.V.:300] Out: -   BLOOD ADMINISTERED:none  DRAINS: none   LOCAL MEDICATIONS USED:  MARCAINE   6cc  SPECIMEN:  Biopsy / Limited Resection  DISPOSITION OF SPECIMEN:  PATHOLOGY  COUNTS:  YES  TOURNIQUET:  * Missing tourniquet times found for documented tourniquets in log:  40195 *  DICTATION: .Other Dictation: Dictation Number [id number]  PLAN OF CARE: Discharge to home after PACU  PATIENT DISPOSITION:  PACU - hemodynamically stable.   Delay start of Pharmacological VTE agent (>24hrs) due to surgical blood loss or risk of bleeding: not applicable

## 2012-05-14 NOTE — Preoperative (Signed)
Beta Blockers   Reason not to administer Beta Blockers:Not Applicable 

## 2012-05-14 NOTE — H&P (Signed)
[name] is an 66 y.o. female.   Chief Complaint: left long pain and deformity HPI: as above  Unknown injury to left long finger with p-2 fracture  Past Medical History  Diagnosis Date  . Hypertension   . DEMENTIA   . Diabetes mellitus   . Arthritis     Past Surgical History  Procedure Date  . Toe amputation     2 toes  . Ankle broken   . Clavicle surgery   . Abdominal hysterectomy   . Fracture surgery     ankle  . Cholecystectomy     History reviewed. No pertinent family history. Social History:  reports that she has never smoked. She does not have any smokeless tobacco history on file. She reports that she does not drink alcohol or use illicit drugs.  Allergies:  Allergies  Allergen Reactions  . Penicillins Anaphylaxis  . Vancomycin Rash    Medications Prior to Admission  Medication Sig Dispense Refill  . ALPRAZolam (XANAX) 0.25 MG tablet Take 0.25 mg by mouth at bedtime as needed. For sleep      . amitriptyline (ELAVIL) 10 MG tablet Take 10 mg by mouth at bedtime as needed. For sleep      . amLODipine (NORVASC) 10 MG tablet Take 10 mg by mouth daily.      . cephALEXin (KEFLEX) 250 MG capsule Take 250 mg by mouth 3 (three) times daily. For 2 weeks. Only one tablet left      . furosemide (LASIX) 20 MG tablet Take 20 mg by mouth 2 (two) times daily.      . insulin NPH (HUMULIN N,NOVOLIN N) 100 UNIT/ML injection Inject 10-20 Units into the skin 2 (two) times daily before a meal. 20 units in am and 10 units in pm      . levothyroxine (SYNTHROID, LEVOTHROID) 25 MCG tablet Take 25 mcg by mouth daily.      . metoprolol tartrate (LOPRESSOR) 25 MG tablet Take 25 mg by mouth 2 (two) times daily.      . Multiple Vitamins-Minerals (CENTRUM PO) Take 1 tablet by mouth daily.        Results for orders placed during the hospital encounter of 05/14/12 (from the past 48 hour(s))  GLUCOSE, CAPILLARY     Status: Abnormal   Collection Time   05/14/12 12:07 PM      Component  Value Range Comment   Glucose-Capillary 195 (*) 70 - 99 (mg/dL)   CBC     Status: Abnormal   Collection Time   05/14/12 12:17 PM      Component Value Range Comment   WBC 7.6  4.0 - 10.5 (K/uL)    RBC 3.21 (*) 3.87 - 5.11 (MIL/uL)    Hemoglobin 9.1 (*) 12.0 - 15.0 (g/dL)    HCT 16.1 (*) 09.6 - 46.0 (%)    MCV 86.9  78.0 - 100.0 (fL)    MCH 28.3  26.0 - 34.0 (pg)    MCHC 32.6  30.0 - 36.0 (g/dL)    RDW 04.5  40.9 - 81.1 (%)    Platelets 225  150 - 400 (K/uL)   BASIC METABOLIC PANEL     Status: Abnormal   Collection Time   05/14/12 12:17 PM      Component Value Range Comment   Sodium 146 (*) 135 - 145 (mEq/L)    Potassium 4.0  3.5 - 5.1 (mEq/L)    Chloride 112  96 - 112 (mEq/L)    CO2  18 (*) 19 - 32 (mEq/L)    Glucose, Bld 215 (*) 70 - 99 (mg/dL)    BUN 48 (*) 6 - 23 (mg/dL)    Creatinine, Ser 1.19 (*) 0.50 - 1.10 (mg/dL)    Calcium 9.3  8.4 - 10.5 (mg/dL)    GFR calc non Af Amer 9 (*) >90 (mL/min)    GFR calc Af Amer 11 (*) >90 (mL/min)    No results found.  Review of Systems  All other systems reviewed and are negative.    Blood pressure 167/96, pulse 94, temperature 98.3 F (36.8 C), temperature source Oral, resp. rate 18, SpO2 94.00%. Physical Exam  Constitutional: She appears well-developed and well-nourished.  HENT:  Head: Normocephalic and atraumatic.  Cardiovascular: Normal rate.   Respiratory: Effort normal.  Musculoskeletal:       Left hand: She exhibits tenderness, bony tenderness and laceration.       Hands: Neurological: She is alert.  Skin: Skin is warm.  Psychiatric: She has a normal mood and affect. Her speech is normal and behavior is normal.     Assessment/Plan As above   Plan I and D with orif  Morey Andonian A 05/14/2012, 1:28 PM

## 2012-05-14 NOTE — Anesthesia Postprocedure Evaluation (Signed)
  Anesthesia Post-op Note  Patient: [name]  Procedure(s) Performed: Procedure(s) (LRB): IRRIGATION AND DEBRIDEMENT EXTREMITY (Left) OPEN REDUCTION INTERNAL FIXATION (ORIF) METACARPAL (FINGER) FRACTURE (Left)  Patient Location: PACU  Anesthesia Type: General  Level of Consciousness: awake and alert   Airway and Oxygen Therapy: Patient Spontanous Breathing and Patient connected to nasal cannula oxygen  Post-op Pain: mild  Post-op Assessment: Post-op Vital signs reviewed  Post-op Vital Signs: Reviewed  Complications: No apparent anesthesia complications

## 2012-05-14 NOTE — Anesthesia Preprocedure Evaluation (Signed)
Anesthesia Evaluation  Patient identified by MRN, date of birth, ID band Patient confused  General Assessment Comment:Daughter with pt  Reviewed: Allergy & Precautions, H&P , NPO status , Patient's Chart, lab work & pertinent test results, reviewed documented beta blocker date and time   History of Anesthesia Complications Negative for: history of anesthetic complications  Airway Mallampati: II TM Distance: >3 FB Neck ROM: Full    Dental  (+) Poor Dentition and Dental Advisory Given   Pulmonary neg pulmonary ROS,  breath sounds clear to auscultation  Pulmonary exam normal       Cardiovascular hypertension, Pt. on medications and Pt. on home beta blockers + CAD Rhythm:Regular Rate:Normal     Neuro/Psych Dementia    GI/Hepatic negative GI ROS, Neg liver ROS,   Endo/Other  Diabetes mellitus-, Insulin DependentMorbid obesity  Renal/GU negative Renal ROS     Musculoskeletal   Abdominal   Peds  Hematology   Anesthesia Other Findings   Reproductive/Obstetrics                           Anesthesia Physical Anesthesia Plan  ASA: III  Anesthesia Plan: General   Post-op Pain Management:    Induction: Intravenous  Airway Management Planned: LMA  Additional Equipment:   Intra-op Plan:   Post-operative Plan: Extubation in OR  Informed Consent: I have reviewed the patients History and Physical, chart, labs and discussed the procedure including the risks, benefits and alternatives for the proposed anesthesia with the patient or authorized representative who has indicated his/her understanding and acceptance.   Dental advisory given  Plan Discussed with: CRNA and Surgeon  Anesthesia Plan Comments:         Anesthesia Quick Evaluation

## 2012-05-15 NOTE — Op Note (Signed)
NAMELORALYN, [name]            ACCOUNT NO.:  [id number]  MEDICAL RECORD NO.:  [id number]  LOCATION:  [facility]                         FACILITY:  [facility]  PHYSICIAN:  Artist Pais. Marrianne Sica, M.D.DATE OF BIRTH:  1946/12/21  DATE OF PROCEDURE:  05/14/2012 DATE OF DISCHARGE:  05/14/2012                              OPERATIVE REPORT   PREOPERATIVE DIAGNOSIS:  Chronic open wound and chronic open fracture, left long finger middle phalanx distal interphalangeal joint.  POSTOPERATIVE DIAGNOSIS:  Chronic open wound and chronic open fracture, left long finger middle phalanx distal interphalangeal joint.  PROCEDURE:  I and D of above with cultures and primary attempted arthrodesis, DIP joint, left long finger.  SURGEON:  Artist Pais. Mina Marble, MD  ASSISTANT:  None.  ANESTHESIA:  General.  Cultures were sent.  The patient was taken to the operating suite.  After induction of general anesthesia, left upper extremity was prepped and draped in sterile fashion.  An Esmarch was used to exsanguinate the limb. Tourniquet inflated to 250 mmHg.  At this point in time, an open wound over the DIP joint of the left long finger was extended proximally and distally in Z-type fashion.  Dissection was carried down to the area of the DIP joint.  There was an old open fracture of the distal aspect of the middle phalanx with the articular surface flipped 90 degrees volarly.  There was complete disruption of the extensor tendon with exposed middle phalangeal bone.  The fragment was freed, denuded of any blood supply, and showed evidence of osteomyelitis.  It was excised and sent for pathologic confirmation and cultures.  Soft tissues were cultured as well.  Dissection of the middle phalanx was taken down until normal-appearing bone was undertaken.  The base of the distal phalanx was decorticated.  The wound was thoroughly irrigated with a liter of normal saline, and a single 0.035 K-wire was driven from distal  to proximal across the injury site and attempted arthrodesis of this left long finger.  The wound was then irrigated.  Excess skin was excised and was closed loosely with 4-0 Vicryl Rapide suture.  Xeroform, 4x4s, and Coban wrap was applied.  The patient tolerated the procedure well and went to the recovery room in stable fashion.    Artist Pais Mina Marble, M.D.    MAW/MEDQ  D:  05/14/2012  T:  05/15/2012  Job:  [id number]

## 2012-05-17 LAB — WOUND CULTURE

## 2012-05-18 LAB — ANAEROBIC CULTURE

## 2012-05-19 ENCOUNTER — Encounter (HOSPITAL_COMMUNITY): Payer: Self-pay | Admitting: Orthopedic Surgery

## 2012-05-19 LAB — TISSUE CULTURE

## 2012-05-26 ENCOUNTER — Encounter (HOSPITAL_BASED_OUTPATIENT_CLINIC_OR_DEPARTMENT_OTHER): Payer: Medicare Other

## 2012-06-28 ENCOUNTER — Encounter (HOSPITAL_COMMUNITY): Payer: Self-pay | Admitting: Emergency Medicine

## 2012-06-28 ENCOUNTER — Emergency Department (HOSPITAL_COMMUNITY)
Admission: EM | Admit: 2012-06-28 | Discharge: 2012-06-28 | Disposition: A | Discharge status: Home / Self Care | Payer: Medicare Other | Attending: Emergency Medicine | Admitting: Emergency Medicine

## 2012-06-28 DIAGNOSIS — Z8739 Personal history of other diseases of the musculoskeletal system and connective tissue: Secondary | ICD-10-CM | POA: Insufficient documentation

## 2012-06-28 DIAGNOSIS — Z79899 Other long term (current) drug therapy: Secondary | ICD-10-CM | POA: Insufficient documentation

## 2012-06-28 DIAGNOSIS — N289 Disorder of kidney and ureter, unspecified: Secondary | ICD-10-CM

## 2012-06-28 DIAGNOSIS — I10 Essential (primary) hypertension: Secondary | ICD-10-CM | POA: Insufficient documentation

## 2012-06-28 DIAGNOSIS — Z794 Long term (current) use of insulin: Secondary | ICD-10-CM | POA: Insufficient documentation

## 2012-06-28 DIAGNOSIS — D649 Anemia, unspecified: Secondary | ICD-10-CM

## 2012-06-28 DIAGNOSIS — F0390 Unspecified dementia without behavioral disturbance: Secondary | ICD-10-CM | POA: Insufficient documentation

## 2012-06-28 DIAGNOSIS — A0472 Enterocolitis due to Clostridium difficile, not specified as recurrent: Secondary | ICD-10-CM

## 2012-06-28 DIAGNOSIS — E119 Type 2 diabetes mellitus without complications: Secondary | ICD-10-CM | POA: Insufficient documentation

## 2012-06-28 LAB — CBC WITH DIFFERENTIAL/PLATELET
Basophils Absolute: 0 10*3/uL (ref 0.0–0.1)
Basophils Relative: 0 % (ref 0–1)
Eosinophils Absolute: 0.1 10*3/uL (ref 0.0–0.7)
MCH: 28.3 pg (ref 26.0–34.0)
MCHC: 33.5 g/dL (ref 30.0–36.0)
Monocytes Relative: 5 % (ref 3–12)
Neutro Abs: 7.6 10*3/uL (ref 1.7–7.7)
Neutrophils Relative %: 73 % (ref 43–77)
Platelets: 430 10*3/uL — ABNORMAL HIGH (ref 150–400)
RDW: 14.5 % (ref 11.5–15.5)

## 2012-06-28 LAB — COMPREHENSIVE METABOLIC PANEL
AST: 24 U/L (ref 0–37)
Albumin: 2.9 g/dL — ABNORMAL LOW (ref 3.5–5.2)
Alkaline Phosphatase: 94 U/L (ref 39–117)
BUN: 57 mg/dL — ABNORMAL HIGH (ref 6–23)
Chloride: 103 mEq/L (ref 96–112)
Potassium: 4.1 mEq/L (ref 3.5–5.1)
Sodium: 136 mEq/L (ref 135–145)
Total Protein: 6.4 g/dL (ref 6.0–8.3)

## 2012-06-28 MED ORDER — METRONIDAZOLE 500 MG PO TABS
500.0000 mg | ORAL_TABLET | Freq: Once | ORAL | Status: AC
  Administered 2012-06-28: 500 mg via ORAL
  Filled 2012-06-28: qty 1

## 2012-06-28 MED ORDER — METRONIDAZOLE 500 MG PO TABS: 500.0000 mg | ORAL_TABLET | Freq: Two times a day (BID) | ORAL | Status: AC

## 2012-06-28 NOTE — ED Notes (Signed)
Assisted pt on bedpan, pt was unable to give urine sample

## 2012-06-28 NOTE — ED Notes (Signed)
Pt c/o rectal bleeding and puss noted x 3 days; per family foul odor noted; unsure of cause; per family pt c/o mild abd on Saturday but no complaints since; pt with dementia

## 2012-06-28 NOTE — ED Provider Notes (Signed)
History     CSN: [id number]  Arrival date & time 06/28/12  1207   First MD Initiated Contact with Patient 06/28/12 1447      Chief Complaint  Patient presents with  . Rectal Pain    (Consider location/radiation/quality/duration/timing/severity/associated sxs/prior treatment) The history is provided by a relative. The history is limited by the condition of the patient (dementia).  66 year old female had family notice her stool had blood and a foul odor over the last 3 days. She's not having bowel movements anymore frequently than normal but the blood and motor are new. Of note, she has been on Cipro for the last 2 months for an infection in her hand. She's not had any fever, chills, sweats. There's been no nausea or vomiting. Her appetite has been good. She's not had any abdominal pain. Family has not given her any treatment.  Past Medical History  Diagnosis Date  . Hypertension   . DEMENTIA   . Diabetes mellitus   . Arthritis     Past Surgical History  Procedure Date  . Toe amputation     2 toes  . Ankle broken   . Clavicle surgery   . Abdominal hysterectomy   . Fracture surgery     ankle  . Cholecystectomy   . I&d extremity 05/14/2012    Procedure: IRRIGATION AND DEBRIDEMENT EXTREMITY;  Surgeon: Marlowe Shores, MD;  Location: [facility];  Service: Orthopedics;  Laterality: Left;  . Orif finger fracture 05/14/2012    Procedure: OPEN REDUCTION INTERNAL FIXATION (ORIF) METACARPAL (FINGER) FRACTURE;  Surgeon: Marlowe Shores, MD;  Location: [facility];  Service: Orthopedics;  Laterality: Left;    History reviewed. No pertinent family history.  History  Substance Use Topics  . Smoking status: Never Smoker   . Smokeless tobacco: Not on file  . Alcohol Use: No    OB History    Grav Para Term Preterm Abortions TAB SAB Ect Mult Living                  Review of Systems  Unable to perform ROS: Dementia    Allergies  Penicillins and Vancomycin  Home Medications    Current Outpatient Rx  Name Route Sig Dispense Refill  . ALPRAZOLAM 0.25 MG PO TABS Oral Take 0.25 mg by mouth at bedtime as needed. For sleep    . AMITRIPTYLINE HCL 50 MG PO TABS Oral Take 50 mg by mouth at bedtime.    [name] AMLODIPINE BESYLATE 10 MG PO TABS Oral Take 10 mg by mouth daily.    [name] CIPROFLOXACIN HCL 500 MG PO TABS Oral Take 500 mg by mouth 2 (two) times daily. Patient is on a long course post finger surgery.    . FUROSEMIDE 80 MG PO TABS Oral Take 80 mg by mouth 2 (two) times daily.    [name] LEVOTHYROXINE SODIUM 25 MCG PO TABS Oral Take 25 mcg by mouth daily.    [name] METOPROLOL TARTRATE 25 MG PO TABS Oral Take 25 mg by mouth 2 (two) times daily.    . OXYCODONE-ACETAMINOPHEN 5-325 MG PO TABS Oral Take 1 tablet by mouth every 4 (four) hours as needed. pain    . INSULIN ISOPHANE HUMAN 100 UNIT/ML [place] SUSP Subcutaneous Inject 10-20 Units into the skin 2 (two) times daily before a meal. 20 units in am and 10 units in pm    . CENTRUM PO Oral Take 1 tablet by mouth daily.      BP  140/67  Pulse 77  Temp 98.4 F (36.9 C) (Oral)  Resp 20  SpO2 99%  Physical Exam  Nursing note and vitals reviewed.  66 year old female is resting comfortably and in no acute distress. Vital signs are normal. Oxygen saturation is 99% which is normal. Head is normocephalic and atraumatic. PERRLA, EOMI. Neck is nontender and supple. Back is nontender. Lungs are clear without rales, wheezes, rhonchi. Heart has regular rate rhythm without murmur. Abdomen is soft, flat, nontender without masses or hepatosplenomegaly. Peristalsis is normal active. Rectal shows a very small amount of bright red blood and remainder stool is normal color. Extremities have 3+ edema with venous stasis changes present. Skin is warm and dry without other rash. Neurologic: She is awake and alert and responds to questions appropriately and follows commands well. Cranial nerves are intact. There no motor or sensory deficits.  ED Course  Procedures  (including critical care time)  Results for orders placed during the hospital encounter of 06/28/12  CBC WITH DIFFERENTIAL      Component Value Range   WBC 10.4  4.0 - 10.5 K/uL   RBC 2.93 (*) 3.87 - 5.11 MIL/uL   Hemoglobin 8.3 (*) 12.0 - 15.0 g/dL   HCT 16.1 (*) 09.6 - 04.5 %   MCV 84.6  78.0 - 100.0 fL   MCH 28.3  26.0 - 34.0 pg   MCHC 33.5  30.0 - 36.0 g/dL   RDW 40.9  81.1 - 91.4 %   Platelets 430 (*) 150 - 400 K/uL   Neutrophils Relative 73  43 - 77 %   Neutro Abs 7.6  1.7 - 7.7 K/uL   Lymphocytes Relative 20  12 - 46 %   Lymphs Abs 2.1  0.7 - 4.0 K/uL   Monocytes Relative 5  3 - 12 %   Monocytes Absolute 0.6  0.1 - 1.0 K/uL   Eosinophils Relative 1  0 - 5 %   Eosinophils Absolute 0.1  0.0 - 0.7 K/uL   Basophils Relative 0  0 - 1 %   Basophils Absolute 0.0  0.0 - 0.1 K/uL  COMPREHENSIVE METABOLIC PANEL      Component Value Range   Sodium 136  135 - 145 mEq/L   Potassium 4.1  3.5 - 5.1 mEq/L   Chloride 103  96 - 112 mEq/L   CO2 21  19 - 32 mEq/L   Glucose, Bld 134 (*) 70 - 99 mg/dL   BUN 57 (*) 6 - 23 mg/dL   Creatinine, Ser 7.82 (*) 0.50 - 1.10 mg/dL   Calcium 8.9  8.4 - 95.6 mg/dL   Total Protein 6.4  6.0 - 8.3 g/dL   Albumin 2.9 (*) 3.5 - 5.2 g/dL   AST 24  0 - 37 U/L   ALT 22  0 - 35 U/L   Alkaline Phosphatase 94  39 - 117 U/L   Total Bilirubin 0.1 (*) 0.3 - 1.2 mg/dL   GFR calc non Af Amer 8 (*) >90 mL/min   GFR calc Af Amer 10 (*) >90 mL/min  OCCULT BLOOD, POC DEVICE      Component Value Range   Fecal Occult Bld POSITIVE       1. C. difficile colitis   2. Renal insufficiency   3. Anemia       MDM  Rectal bleeding with foul-smelling stool in a patient who is on long-term ciprofloxacin. This is strongly suggestive of Clostridium difficile colitis.  Creatinine is noted to be gradually  and persistently rising. Hemoglobin has fallen slightly but it appears to be at about her baseline and is appropriate for degree of renal insufficiency. She will be  given empiric treatment with metronidazole.        Dione Booze, MD 06/28/12 [phone]

## 2012-11-14 ENCOUNTER — Emergency Department (HOSPITAL_COMMUNITY): Payer: Medicare Other

## 2012-11-14 ENCOUNTER — Emergency Department (HOSPITAL_COMMUNITY)
Admission: EM | Admit: 2012-11-14 | Discharge: 2012-11-14 | Disposition: A | Discharge status: Home / Self Care | Payer: Medicare Other | Attending: Emergency Medicine | Admitting: Emergency Medicine

## 2012-11-14 ENCOUNTER — Encounter (HOSPITAL_COMMUNITY): Payer: Self-pay | Admitting: *Deleted

## 2012-11-14 DIAGNOSIS — W07XXXA Fall from chair, initial encounter: Secondary | ICD-10-CM | POA: Insufficient documentation

## 2012-11-14 DIAGNOSIS — N390 Urinary tract infection, site not specified: Secondary | ICD-10-CM | POA: Insufficient documentation

## 2012-11-14 DIAGNOSIS — Y939 Activity, unspecified: Secondary | ICD-10-CM | POA: Insufficient documentation

## 2012-11-14 DIAGNOSIS — Y929 Unspecified place or not applicable: Secondary | ICD-10-CM | POA: Insufficient documentation

## 2012-11-14 DIAGNOSIS — R5383 Other fatigue: Secondary | ICD-10-CM | POA: Insufficient documentation

## 2012-11-14 DIAGNOSIS — Z79899 Other long term (current) drug therapy: Secondary | ICD-10-CM | POA: Insufficient documentation

## 2012-11-14 DIAGNOSIS — F0390 Unspecified dementia without behavioral disturbance: Secondary | ICD-10-CM | POA: Insufficient documentation

## 2012-11-14 DIAGNOSIS — I10 Essential (primary) hypertension: Secondary | ICD-10-CM | POA: Insufficient documentation

## 2012-11-14 DIAGNOSIS — R5381 Other malaise: Secondary | ICD-10-CM | POA: Insufficient documentation

## 2012-11-14 DIAGNOSIS — M129 Arthropathy, unspecified: Secondary | ICD-10-CM | POA: Insufficient documentation

## 2012-11-14 DIAGNOSIS — R531 Weakness: Secondary | ICD-10-CM

## 2012-11-14 DIAGNOSIS — E119 Type 2 diabetes mellitus without complications: Secondary | ICD-10-CM | POA: Insufficient documentation

## 2012-11-14 LAB — POCT I-STAT, CHEM 8
BUN: 63 mg/dL — ABNORMAL HIGH (ref 6–23)
Chloride: 113 mEq/L — ABNORMAL HIGH (ref 96–112)
Creatinine, Ser: 4.9 mg/dL — ABNORMAL HIGH (ref 0.50–1.10)
Sodium: 148 mEq/L — ABNORMAL HIGH (ref 135–145)
TCO2: 22 mmol/L (ref 0–100)

## 2012-11-14 LAB — CBC WITH DIFFERENTIAL/PLATELET
Eosinophils Relative: 2 % (ref 0–5)
HCT: 28.2 % — ABNORMAL LOW (ref 36.0–46.0)
Lymphocytes Relative: 29 % (ref 12–46)
Lymphs Abs: 2.7 10*3/uL (ref 0.7–4.0)
MCV: 87.9 fL (ref 78.0–100.0)
Monocytes Absolute: 0.6 10*3/uL (ref 0.1–1.0)
Neutro Abs: 6 10*3/uL (ref 1.7–7.7)
Platelets: 404 10*3/uL — ABNORMAL HIGH (ref 150–400)
RBC: 3.21 MIL/uL — ABNORMAL LOW (ref 3.87–5.11)
WBC: 9.5 10*3/uL (ref 4.0–10.5)

## 2012-11-14 LAB — BASIC METABOLIC PANEL
CO2: 22 mEq/L (ref 19–32)
Calcium: 9.4 mg/dL (ref 8.4–10.5)
Chloride: 110 mEq/L (ref 96–112)
Glucose, Bld: 103 mg/dL — ABNORMAL HIGH (ref 70–99)
Sodium: 145 mEq/L (ref 135–145)

## 2012-11-14 LAB — URINALYSIS, ROUTINE W REFLEX MICROSCOPIC
Glucose, UA: NEGATIVE mg/dL
Specific Gravity, Urine: 1.011 (ref 1.005–1.030)

## 2012-11-14 LAB — TROPONIN I: Troponin I: <0.3 ng/mL (ref <0.30)

## 2012-11-14 LAB — URINE MICROSCOPIC-ADD ON

## 2012-11-14 MED ORDER — HYDROCODONE-ACETAMINOPHEN 5-325 MG PO TABS
1.0000 | ORAL_TABLET | Freq: Once | ORAL | Status: AC
  Administered 2012-11-14: 1 via ORAL
  Filled 2012-11-14: qty 1

## 2012-11-14 MED ORDER — HYDROCODONE-ACETAMINOPHEN 5-325 MG PO TABS: ORAL_TABLET | ORAL | Status: DC

## 2012-11-14 MED ORDER — CIPROFLOXACIN HCL 500 MG PO TABS
500.0000 mg | ORAL_TABLET | Freq: Once | ORAL | Status: AC
Start: 2012-11-14 — End: 2012-11-14
  Administered 2012-11-14: 500 mg via ORAL
  Filled 2012-11-14: qty 1

## 2012-11-14 MED ORDER — CIPROFLOXACIN HCL 500 MG PO TABS: 500.0000 mg | ORAL_TABLET | Freq: Every day | ORAL | Status: DC

## 2012-11-14 NOTE — ED Notes (Signed)
Pt eating with family at bedside

## 2012-11-14 NOTE — ED Notes (Signed)
Family reports that pt lives with them, had a fall on Thursday but denied having any pain after the fall. Started noticing yesterday that pt having weakness and not bearing as much weight on right leg, today unable to ambulate due to weakness. Baseline is for pt to ambulate with a walker.

## 2012-11-14 NOTE — ED Notes (Signed)
Josh Geiple, PA at bedside 

## 2012-11-14 NOTE — ED Notes (Signed)
Pt in radiology 

## 2012-11-14 NOTE — ED Provider Notes (Signed)
History     CSN: [id number]  Arrival date & time 11/14/12  1240   First MD Initiated Contact with Patient 11/14/12 1330      Chief Complaint  Patient presents with  . Weakness  . Fall    (Consider location/radiation/quality/duration/timing/severity/associated sxs/prior treatment) HPI Comments: Level V caveat 2/2 dementia -- patient had fall 3 days ago from a chair onto her R side. After fall she was ambulating with her walker at baseline. She has had gradual decrease in ambulation over the past 2 days to the point where she can no longer ambulate. Otherwise patient's mental status is at baseline. No fever, N/V/D, cough, cold, urinary sx per family.   Patient is a 66 y.o. female presenting with weakness. The history is provided by the patient and a relative.  Weakness  Additional symptoms include weakness.    Past Medical History  Diagnosis Date  . Hypertension   . DEMENTIA   . Diabetes mellitus   . Arthritis     Past Surgical History  Procedure Date  . Toe amputation     2 toes  . Ankle broken   . Clavicle surgery   . Abdominal hysterectomy   . Fracture surgery     ankle  . Cholecystectomy   . I&d extremity 05/14/2012    Procedure: IRRIGATION AND DEBRIDEMENT EXTREMITY;  Surgeon: Marlowe Shores, MD;  Location: [facility];  Service: Orthopedics;  Laterality: Left;  . Orif finger fracture 05/14/2012    Procedure: OPEN REDUCTION INTERNAL FIXATION (ORIF) METACARPAL (FINGER) FRACTURE;  Surgeon: Marlowe Shores, MD;  Location: [facility];  Service: Orthopedics;  Laterality: Left;    History reviewed. No pertinent family history.  History  Substance Use Topics  . Smoking status: Never Smoker   . Smokeless tobacco: Not on file  . Alcohol Use: No    OB History    Grav Para Term Preterm Abortions TAB SAB Ect Mult Living                  Review of Systems  Unable to perform ROS: Dementia  Neurological: Positive for weakness.    Allergies  Penicillins and  Vancomycin  Home Medications   Current Outpatient Rx  Name  Route  Sig  Dispense  Refill  . ALPRAZOLAM 0.25 MG PO TABS   Oral   Take 0.25 mg by mouth at bedtime as needed. For sleep         . AMITRIPTYLINE HCL 50 MG PO TABS   Oral   Take 50 mg by mouth at bedtime.         [name] AMLODIPINE BESYLATE 10 MG PO TABS   Oral   Take 10 mg by mouth daily.         [name] CIPROFLOXACIN HCL 500 MG PO TABS   Oral   Take 500 mg by mouth 2 (two) times daily. Patient is on a long course post finger surgery.         . FUROSEMIDE 80 MG PO TABS   Oral   Take 80 mg by mouth 2 (two) times daily.         . INSULIN ISOPHANE HUMAN 100 UNIT/ML [place] SUSP   Subcutaneous   Inject 10-20 Units into the skin 2 (two) times daily before a meal. 20 units in am and 10 units in pm         . LEVOTHYROXINE SODIUM 25 MCG PO TABS   Oral   Take 25 mcg by  mouth daily.         [name] METOPROLOL TARTRATE 25 MG PO TABS   Oral   Take 25 mg by mouth 2 (two) times daily.         . CENTRUM PO   Oral   Take 1 tablet by mouth daily.         . OXYCODONE-ACETAMINOPHEN 5-325 MG PO TABS   Oral   Take 1 tablet by mouth every 4 (four) hours as needed. pain           BP 135/55  Pulse 60  Temp 98.2 F (36.8 C) (Oral)  Resp 18  SpO2 100%  Physical Exam  Nursing note and vitals reviewed. Constitutional: She appears well-developed and well-nourished.  HENT:  Head: Normocephalic and atraumatic.  Right Ear: Tympanic membrane, external ear and ear canal normal.  Left Ear: Tympanic membrane, external ear and ear canal normal.  Nose: Nose normal.  Mouth/Throat: Uvula is midline, oropharynx is clear and moist and mucous membranes are normal.  Eyes: Conjunctivae normal, EOM and lids are normal. Pupils are equal, round, and reactive to light. Right eye exhibits no nystagmus. Left eye exhibits no nystagmus.  Neck: Normal range of motion. Neck supple.  Cardiovascular: Normal rate and regular rhythm.     Pulmonary/Chest: Effort normal and breath sounds normal.  Abdominal: Soft. There is no tenderness.  Musculoskeletal:       Right hip: She exhibits tenderness.       Right knee: She exhibits no swelling. tenderness found.       Right ankle: Normal. no tenderness.       Cervical back: She exhibits normal range of motion, no tenderness and no bony tenderness.       Venous stasis change bilateral LE. Patient uncooperative with ROM R knee and hip. States she has pain with movement but does not appear uncomfortable.   Neurological: She is alert. She has normal strength. No sensory deficit. She exhibits normal muscle tone. GCS eye subscore is 4. GCS verbal subscore is 5. GCS motor subscore is 6.       Patient is unable to cooperate with most of neuro exam. She has gross movement in all 4 extremities with sensation intact to light tough. Dementia at baseline.   Skin: Skin is warm and dry.  Psychiatric: She has a normal mood and affect.    ED Course  Procedures (including critical care time)  Labs Reviewed  URINALYSIS, ROUTINE W REFLEX MICROSCOPIC - Abnormal; Notable for the following:    APPearance CLOUDY (*)     Hgb urine dipstick SMALL (*)     Protein, ur 100 (*)     Leukocytes, UA MODERATE (*)     All other components within normal limits  CBC WITH DIFFERENTIAL - Abnormal; Notable for the following:    RBC 3.21 (*)     Hemoglobin 9.3 (*)     HCT 28.2 (*)     Platelets 404 (*)     All other components within normal limits  BASIC METABOLIC PANEL - Abnormal; Notable for the following:    Glucose, Bld 103 (*)     BUN 63 (*)     Creatinine, Ser 5.43 (*)     GFR calc non Af Amer 7 (*)     GFR calc Af Amer 9 (*)     All other components within normal limits  POCT I-STAT, CHEM 8 - Abnormal; Notable for the following:    Sodium 148 (*)  Chloride 113 (*)     BUN 63 (*)     Creatinine, Ser 4.90 (*)     Glucose, Bld 102 (*)     Hemoglobin 9.2 (*)     HCT 27.0 (*)     All other  components within normal limits  URINE MICROSCOPIC-ADD ON - Abnormal; Notable for the following:    Bacteria, UA MANY (*)     All other components within normal limits  TROPONIN I  URINE CULTURE   Dg Hip Complete Right  11/14/2012  *RADIOLOGY REPORT*  Clinical Data: Right hip pain.  RIGHT HIP - COMPLETE 2+ VIEW  Comparison: None.  Findings: Both hips are located.  No fracture is identified.  There is some enthesopathic change at the right iliopsoas tendon insertion on the lesser trochanter.  Prominent stool ball in the rectum noted.  IMPRESSION:  1.  No acute bony or joint abnormality. 2.  Large stool ball in the rectum.   Original Report Authenticated By: Holley Dexter, M.D.    Dg Knee 2 Views Right  11/14/2012  *RADIOLOGY REPORT*  Clinical Data: Pain.  RIGHT KNEE - 1-2 VIEW  Comparison: None.  Findings: There is no acute bony or joint abnormality.  The patient has advanced tricompartmental osteoarthritis which appears worst in the patellofemoral compartment.  Calcification of the distal quadriceps tendon is likely due to old trauma.  IMPRESSION:  1.  No acute finding. 2.  Advanced degenerative disease.   Original Report Authenticated By: Holley Dexter, M.D.    Ct Head Wo Contrast  11/14/2012  *RADIOLOGY REPORT*  Clinical Data: Fall on Thursday.  Weakness.  Demented. Hypertension.  Diabetes.  CT HEAD WITHOUT CONTRAST  Technique:  Contiguous axial images were obtained from the base of the skull through the vertex without contrast.  Comparison: 09/17/2011  Findings: Bone windows demonstrate possible high left parietal scalp soft tissue swelling, mild.  No skull fracture.  Clear paranasal sinuses and mastoid air cells.  Soft tissue windows demonstrate moderate low density in the periventricular white matter likely related to small vessel disease.Mild cerebral and cerebellar atrophy.  Remote lacunar infarcts in the bilateral basal ganglia. No  mass lesion, hemorrhage, hydrocephalus, acute  infarct, intra-axial, or extra- axial fluid collection.  IMPRESSION: No acute intracranial abnormality.  Cerebral atrophy and small vessel ischemic change.  Remote bilateral lacunar infarcts.  Possible left parietal scalp soft tissue swelling.   Original Report Authenticated By: Jeronimo Greaves, M.D.      1. UTI (lower urinary tract infection)   2. Generalized weakness     1:56 PM Patient seen and examined. Work-up initiated. D/w Dr. Silverio Lay who will see.   Vital signs reviewed and are as follows: Filed Vitals:   11/14/12 1247  BP: 135/55  Pulse: 60  Temp: 98.2 F (36.8 C)  Resp: 18   Dr. Silverio Lay has seen. Work-up pending. Family comfortable with d/c to home and PCP f/u if work-up neg.   4:35 PM UA shows UTI. I spoke with pharmacist regarding choice of abx given pen allergy and renal function. They recommend Cipro 500mg  ONCE a day.   Family informed of results. Urged PCP f/u this week. They will continue patient's regular vicodin prn pain. Strongly encouraged PCP f/u this week for recheck. Patient urged to return with worsening symptoms or other concerns. Patient verbalized understanding and agrees with plan.     MDM  Trouble ambulating after fall: right after fall patient had no difficulty with ambulation. Pain and disability worsened over  past 2 days -- suggesting muscular pain? X-ray neg today.   CT head neg for stroke as cause of weakness.   Work-up otherwise neg except for UA which shows UTI. Per pharmacy, cipro once a day is appropriate choice. Culture sent. Patient has PCP follow-up. Family comfortable with d/c.   Alerted family of worsening renal function over the past several years and need to work with PCP regarding control of HTN and DM which are not well controlled today.   Pt has mild hypernatremia and hyperchloremia at baseline unlikely contributing today.   Anemia at baseline.        Renne Crigler, [place] 11/14/12 1640

## 2012-11-14 NOTE — ED Notes (Signed)
Dr. Yao at bedside. 

## 2012-11-14 NOTE — ED Notes (Signed)
Pt is leaving with daughter and son in law who are her primary caregivers. Pts daughter given d/c teaching and prescriptions for pt. Pt leaving in wheelchair. Pt does not appear to be in any acute distress upon d/c. Pts daughter and pt have no further questions upon d/c.

## 2012-11-14 NOTE — ED Notes (Addendum)
Pts daughter states that pt fell on Thursday and has not been able to bear weight on right side since fall. Pt daughter states pt is also leaning more towards the right since the fall. Pt has hx of dementia and confusion is the pts baseline. PT family states pt did not lose consciousness during fall

## 2012-11-15 NOTE — ED Provider Notes (Signed)
Medical screening examination/treatment/procedure(s) were conducted as a shared visit with non-physician practitioner(s) and myself.  I personally evaluated the patient during the encounter  [name] is a 66 y.o. female hx of HTN, DM here with s/p fall 3 days ago. Mechanical fall, then afterwards unable to ambulate as much. She is also more weak than usual. She is demented so history obtained by family. Labs nl, xrays showed no fracture, UA + UTI, CT head showed no acute infarct. She felt better in the ED and was able to bear weight. She is d/c home on cipro.    Richardean Canal, MD 11/15/12 [phone]

## 2012-11-16 LAB — URINE CULTURE

## 2012-11-17 NOTE — ED Notes (Signed)
+   Urine Patient treated with Cipro-sensitive to same-chart appended per protocol MD. 

## 2014-01-12 ENCOUNTER — Encounter (HOSPITAL_COMMUNITY): Payer: Self-pay | Admitting: Emergency Medicine

## 2014-01-12 ENCOUNTER — Emergency Department (HOSPITAL_COMMUNITY): Payer: Medicare Other

## 2014-01-12 ENCOUNTER — Emergency Department (HOSPITAL_COMMUNITY)
Admission: EM | Admit: 2014-01-12 | Discharge: 2014-01-12 | Disposition: A | Discharge status: Home / Self Care | Payer: Medicare Other | Attending: Emergency Medicine | Admitting: Emergency Medicine

## 2014-01-12 DIAGNOSIS — M79609 Pain in unspecified limb: Secondary | ICD-10-CM | POA: Insufficient documentation

## 2014-01-12 DIAGNOSIS — Z794 Long term (current) use of insulin: Secondary | ICD-10-CM | POA: Insufficient documentation

## 2014-01-12 DIAGNOSIS — E119 Type 2 diabetes mellitus without complications: Secondary | ICD-10-CM | POA: Insufficient documentation

## 2014-01-12 DIAGNOSIS — M79676 Pain in unspecified toe(s): Secondary | ICD-10-CM | POA: Diagnosis present

## 2014-01-12 DIAGNOSIS — Z880 Allergy status to penicillin: Secondary | ICD-10-CM | POA: Insufficient documentation

## 2014-01-12 DIAGNOSIS — R609 Edema, unspecified: Secondary | ICD-10-CM | POA: Insufficient documentation

## 2014-01-12 DIAGNOSIS — M129 Arthropathy, unspecified: Secondary | ICD-10-CM | POA: Insufficient documentation

## 2014-01-12 DIAGNOSIS — Z79899 Other long term (current) drug therapy: Secondary | ICD-10-CM | POA: Insufficient documentation

## 2014-01-12 DIAGNOSIS — I10 Essential (primary) hypertension: Secondary | ICD-10-CM | POA: Insufficient documentation

## 2014-01-12 DIAGNOSIS — F0390 Unspecified dementia without behavioral disturbance: Secondary | ICD-10-CM | POA: Insufficient documentation

## 2014-01-12 DIAGNOSIS — Z792 Long term (current) use of antibiotics: Secondary | ICD-10-CM | POA: Insufficient documentation

## 2014-01-12 DIAGNOSIS — Z9089 Acquired absence of other organs: Secondary | ICD-10-CM | POA: Insufficient documentation

## 2014-01-12 LAB — GLUCOSE, CAPILLARY
Glucose-Capillary: 57 mg/dL — ABNORMAL LOW (ref 70–99)
Glucose-Capillary: 58 mg/dL — ABNORMAL LOW (ref 70–99)

## 2014-01-12 NOTE — ED Notes (Signed)
Family members verbalized understanding of d/c and f/u.  No additional questions by family regarding d/c.

## 2014-01-12 NOTE — ED Notes (Signed)
Left great toe has dark dried blood under toenail.

## 2014-01-12 NOTE — ED Provider Notes (Signed)
CSN: [id number]     Arrival date & time 01/12/14  1232 History   First MD Initiated Contact with Patient 01/12/14 1340     Chief Complaint  Patient presents with  . Nail Problem   (Consider location/radiation/quality/duration/timing/severity/associated sxs/prior Treatment) Patient is a 68 y.o. female presenting with lower extremity pain. The history is provided by the patient (family).  Foot Pain This is a new problem. The current episode started yesterday. The problem occurs constantly. The problem has not changed since onset.Pertinent negatives include no chest pain, no abdominal pain, no headaches and no shortness of breath. Nothing aggravates the symptoms. Nothing relieves the symptoms. She has tried nothing for the symptoms. The treatment provided no relief.    Past Medical History  Diagnosis Date  . Hypertension   . DEMENTIA   . Diabetes mellitus   . Arthritis    Past Surgical History  Procedure Laterality Date  . Toe amputation      2 toes  . Ankle broken    . Clavicle surgery    . Abdominal hysterectomy    . Fracture surgery      ankle  . Cholecystectomy    . I&d extremity  05/14/2012    Procedure: IRRIGATION AND DEBRIDEMENT EXTREMITY;  Surgeon: Marlowe Shores, MD;  Location: [facility] OR;  Service: Orthopedics;  Laterality: Left;  . Orif finger fracture  05/14/2012    Procedure: OPEN REDUCTION INTERNAL FIXATION (ORIF) METACARPAL (FINGER) FRACTURE;  Surgeon: Marlowe Shores, MD;  Location: [facility] OR;  Service: Orthopedics;  Laterality: Left;   History reviewed. No pertinent family history. History  Substance Use Topics  . Smoking status: Never Smoker   . Smokeless tobacco: Not on file  . Alcohol Use: No   OB History   Grav Para Term Preterm Abortions TAB SAB Ect Mult Living                 Review of Systems  Constitutional: Negative for fever and fatigue.  HENT: Negative for congestion and drooling.   Eyes: Negative for pain.  Respiratory: Negative for cough and  shortness of breath.   Cardiovascular: Negative for chest pain.  Gastrointestinal: Negative for nausea, vomiting, abdominal pain and diarrhea.  Genitourinary: Negative for dysuria and hematuria.  Musculoskeletal: Negative for back pain, gait problem and neck pain.  Skin: Negative for color change.  Neurological: Negative for dizziness and headaches.  Hematological: Negative for adenopathy.  Psychiatric/Behavioral: Negative for behavioral problems.  All other systems reviewed and are negative.    Allergies  Penicillins and Vancomycin  Home Medications   Current Outpatient Rx  Name  Route  Sig  Dispense  Refill  . ALPRAZolam (XANAX) 0.25 MG tablet   Oral   Take 0.25 mg by mouth at bedtime as needed. For sleep         . amitriptyline (ELAVIL) 50 MG tablet   Oral   Take 50 mg by mouth at bedtime.         [name] amLODipine (NORVASC) 10 MG tablet   Oral   Take 10 mg by mouth daily.         . ciprofloxacin (CIPRO) 500 MG tablet   Oral   Take 1 tablet (500 mg total) by mouth daily.   7 tablet   0   . furosemide (LASIX) 80 MG tablet   Oral   Take 80 mg by mouth 2 (two) times daily.         [name] HYDROcodone-acetaminophen (NORCO/VICODIN) 5-325 MG  per tablet      Take 1-2 tablets every 6 hours as needed for severe pain   8 tablet   0   . insulin NPH (HUMULIN N,NOVOLIN N) 100 UNIT/ML injection   Subcutaneous   Inject 10-20 Units into the skin 2 (two) times daily before a meal. 20 units in am and 10 units in pm         . levothyroxine (SYNTHROID, LEVOTHROID) 25 MCG tablet   Oral   Take 25 mcg by mouth daily.         . metoprolol tartrate (LOPRESSOR) 25 MG tablet   Oral   Take 25 mg by mouth 2 (two) times daily.         . Multiple Vitamins-Minerals (CENTRUM PO)   Oral   Take 1 tablet by mouth daily.         . oxyCODONE-acetaminopheMarland Kitchenn (PERCOCET) 5-325 MG per tablet   Oral   Take 1 tablet by mouth every 4 (four) hours as needed. pain          BP 141/72   Pulse 81  Temp(Src) 98.1 F (36.7 C) (Oral)  Resp 18  Ht 5\' 2"  (1.575 m)  Wt 185 lb (83.915 kg)  BMI 33.83 kg/m2  SpO2 100% Physical Exam  Nursing note and vitals reviewed. Constitutional: She is oriented to person, place, and time. She appears well-developed and well-nourished.  HENT:  Head: Normocephalic.  Mouth/Throat: No oropharyngeal exudate.  Eyes: Conjunctivae and EOM are normal. Pupils are equal, round, and reactive to light.  Neck: Normal range of motion. Neck supple.  Cardiovascular: Normal rate, regular rhythm, normal heart sounds and intact distal pulses.  Exam reveals no gallop and no friction rub.   No murmur heard. Pulmonary/Chest: Effort normal and breath sounds normal. No respiratory distress. She has no wheezes.  Abdominal: Soft. Bowel sounds are normal. There is no tenderness. There is no rebound and no guarding.  Musculoskeletal: Normal range of motion. She exhibits no edema and no tenderness.  Small amount of clotted blood at the area of the hyponychium between the nail plate and the distal tip of the first digit on the left foot. Normal movement of the left foot. No focal ttp of left foot.   2+ distal pulses.   Mild pitting edema in bilateral LE's.   Neurological: She is alert and oriented to person, place, and time.  Skin: Skin is warm and dry.  Psychiatric: She has a normal mood and affect. Her behavior is normal.    ED Course  Procedures (including critical care time) Labs Review Labs Reviewed  GLUCOSE, CAPILLARY - Abnormal; Notable for the following:    Glucose-Capillary 57 (*)    All other components within normal limits  GLUCOSE, CAPILLARY - Abnormal; Notable for the following:    Glucose-Capillary 58 (*)    All other components within normal limits   Imaging Review Dg Foot Complete Left  01/12/2014   CLINICAL DATA:  Possible trauma to great toe  EXAM: LEFT FOOT - COMPLETE 3+ VIEW  COMPARISON:  None.  FINDINGS: Three views of the left foot  submitted. No acute fracture or subluxation. There is diffuse osteopenia. Postsurgical changes are noted distal tibia and fibula with metallic fixation material. Small plantar and posterior spur of calcaneus.  IMPRESSION: No acute fracture or subluxation. Diffuse osteopenia. Postsurgical changes distal tibia and fibula. Plantar and posterior spurring of calcaneus.   Electronically Signed   By: [name] MeadLiviu  Pop M.D.   On: 01/12/2014  14:48    EKG Interpretation   None      Left Foot   Left Foot    MDM   1. Toe pain    1:57 PM 68 y.o. female who presents with bleeding from beneath the toenail of the left great toe. The daughter notes that she noticed this yesterday. The patient does not remember any injury. The family states that she has been well otherwise and denies any fever, vomiting, or diarrhea. She had a mildly low blood sugar in triage but was asymptomatic and has eaten since then. She is otherwise afebrile and vital signs are unremarkable here. She has a small amount of clotted blood at the area of the hyponychium between the nail plate and the distal tip of the digit. Will get plain film to rule out traumatic injury. No obvious subungual hematoma noted. Minimal pain on exam.   3:03 PM: Likely occult traumatic injury. Imaging non-contrib. Will have pt f/u w/ pcp in 1 week for repeat exam. Doubt infection. I have discussed the diagnosis/risks/treatment options with the patient and family and believe the pt to be eligible for discharge home to follow-up with pcp in 1 week for repeat exam. We also discussed returning to the ED immediately if new or worsening sx occur. We discussed the sx which are most concerning (e.g., worsening pain, fever, redness, swelling) that necessitate immediate return. Any new prescriptions provided to the patient are listed below.  New Prescriptions   No medications on file     Junius Argyle, MD 01/12/14 1510

## 2014-01-12 NOTE — ED Notes (Signed)
Family noticed bleeding from underneath pt L great toenail yesterday. On exam the toenail appeared darkened to her and her feet are swollen. She called PCP and he instructed pt to come to ed for wound care. Pt does not remember injuring her toe

## 2014-08-08 ENCOUNTER — Emergency Department (HOSPITAL_COMMUNITY): Payer: Medicare Other

## 2014-08-08 ENCOUNTER — Encounter (HOSPITAL_COMMUNITY): Payer: Self-pay | Admitting: Emergency Medicine

## 2014-08-08 ENCOUNTER — Inpatient Hospital Stay (HOSPITAL_COMMUNITY)
Admission: EM | Admit: 2014-08-08 | Discharge: 2014-08-19 | DRG: 870 | Disposition: A | Discharge status: Hospice | Payer: Medicare Other | Attending: Internal Medicine | Admitting: Internal Medicine

## 2014-08-08 DIAGNOSIS — Z881 Allergy status to other antibiotic agents status: Secondary | ICD-10-CM | POA: Diagnosis not present

## 2014-08-08 DIAGNOSIS — F0390 Unspecified dementia without behavioral disturbance: Secondary | ICD-10-CM | POA: Diagnosis present

## 2014-08-08 DIAGNOSIS — I120 Hypertensive chronic kidney disease with stage 5 chronic kidney disease or end stage renal disease: Secondary | ICD-10-CM | POA: Diagnosis present

## 2014-08-08 DIAGNOSIS — N390 Urinary tract infection, site not specified: Secondary | ICD-10-CM | POA: Diagnosis present

## 2014-08-08 DIAGNOSIS — J9600 Acute respiratory failure, unspecified whether with hypoxia or hypercapnia: Secondary | ICD-10-CM | POA: Diagnosis present

## 2014-08-08 DIAGNOSIS — R4182 Altered mental status, unspecified: Secondary | ICD-10-CM | POA: Diagnosis present

## 2014-08-08 DIAGNOSIS — N179 Acute kidney failure, unspecified: Secondary | ICD-10-CM

## 2014-08-08 DIAGNOSIS — Y92009 Unspecified place in unspecified non-institutional (private) residence as the place of occurrence of the external cause: Secondary | ICD-10-CM | POA: Diagnosis not present

## 2014-08-08 DIAGNOSIS — N185 Chronic kidney disease, stage 5: Secondary | ICD-10-CM | POA: Diagnosis present

## 2014-08-08 DIAGNOSIS — Z794 Long term (current) use of insulin: Secondary | ICD-10-CM | POA: Diagnosis not present

## 2014-08-08 DIAGNOSIS — E861 Hypovolemia: Secondary | ICD-10-CM | POA: Diagnosis present

## 2014-08-08 DIAGNOSIS — S0180XA Unspecified open wound of other part of head, initial encounter: Secondary | ICD-10-CM | POA: Diagnosis present

## 2014-08-08 DIAGNOSIS — E872 Acidosis, unspecified: Secondary | ICD-10-CM | POA: Diagnosis present

## 2014-08-08 DIAGNOSIS — Z880 Allergy status to penicillin: Secondary | ICD-10-CM

## 2014-08-08 DIAGNOSIS — Z515 Encounter for palliative care: Secondary | ICD-10-CM

## 2014-08-08 DIAGNOSIS — D649 Anemia, unspecified: Secondary | ICD-10-CM

## 2014-08-08 DIAGNOSIS — I498 Other specified cardiac arrhythmias: Secondary | ICD-10-CM | POA: Diagnosis present

## 2014-08-08 DIAGNOSIS — G9341 Metabolic encephalopathy: Secondary | ICD-10-CM | POA: Diagnosis present

## 2014-08-08 DIAGNOSIS — M129 Arthropathy, unspecified: Secondary | ICD-10-CM | POA: Diagnosis present

## 2014-08-08 DIAGNOSIS — E1129 Type 2 diabetes mellitus with other diabetic kidney complication: Secondary | ICD-10-CM

## 2014-08-08 DIAGNOSIS — T68XXXA Hypothermia, initial encounter: Secondary | ICD-10-CM | POA: Diagnosis present

## 2014-08-08 DIAGNOSIS — R4020 Unspecified coma: Secondary | ICD-10-CM

## 2014-08-08 DIAGNOSIS — K7200 Acute and subacute hepatic failure without coma: Secondary | ICD-10-CM | POA: Diagnosis present

## 2014-08-08 DIAGNOSIS — E1169 Type 2 diabetes mellitus with other specified complication: Secondary | ICD-10-CM | POA: Diagnosis present

## 2014-08-08 DIAGNOSIS — W1811XA Fall from or off toilet without subsequent striking against object, initial encounter: Secondary | ICD-10-CM | POA: Diagnosis present

## 2014-08-08 DIAGNOSIS — E870 Hyperosmolality and hypernatremia: Secondary | ICD-10-CM

## 2014-08-08 DIAGNOSIS — E039 Hypothyroidism, unspecified: Secondary | ICD-10-CM | POA: Diagnosis present

## 2014-08-08 DIAGNOSIS — R579 Shock, unspecified: Secondary | ICD-10-CM

## 2014-08-08 DIAGNOSIS — R001 Bradycardia, unspecified: Secondary | ICD-10-CM

## 2014-08-08 DIAGNOSIS — A419 Sepsis, unspecified organism: Principal | ICD-10-CM

## 2014-08-08 DIAGNOSIS — F0391 Unspecified dementia with behavioral disturbance: Secondary | ICD-10-CM

## 2014-08-08 DIAGNOSIS — R6520 Severe sepsis without septic shock: Secondary | ICD-10-CM

## 2014-08-08 DIAGNOSIS — T68XXXD Hypothermia, subsequent encounter: Secondary | ICD-10-CM

## 2014-08-08 DIAGNOSIS — Z66 Do not resuscitate: Secondary | ICD-10-CM | POA: Diagnosis present

## 2014-08-08 DIAGNOSIS — R6521 Severe sepsis with septic shock: Secondary | ICD-10-CM

## 2014-08-08 DIAGNOSIS — J9601 Acute respiratory failure with hypoxia: Secondary | ICD-10-CM

## 2014-08-08 DIAGNOSIS — D638 Anemia in other chronic diseases classified elsewhere: Secondary | ICD-10-CM

## 2014-08-08 DIAGNOSIS — E1122 Type 2 diabetes mellitus with diabetic chronic kidney disease: Secondary | ICD-10-CM

## 2014-08-08 DIAGNOSIS — R571 Hypovolemic shock: Secondary | ICD-10-CM

## 2014-08-08 DIAGNOSIS — N189 Chronic kidney disease, unspecified: Secondary | ICD-10-CM

## 2014-08-08 HISTORY — DX: Hypothyroidism, unspecified: E03.9

## 2014-08-08 LAB — URINE MICROSCOPIC-ADD ON

## 2014-08-08 LAB — COMPREHENSIVE METABOLIC PANEL
ALT: 176 U/L — AB (ref 0–35)
AST: 159 U/L — ABNORMAL HIGH (ref 0–37)
Albumin: 2.4 g/dL — ABNORMAL LOW (ref 3.5–5.2)
Alkaline Phosphatase: 249 U/L — ABNORMAL HIGH (ref 39–117)
Anion gap: 14 (ref 5–15)
BUN: 123 mg/dL — ABNORMAL HIGH (ref 6–23)
CO2: 13 mEq/L — ABNORMAL LOW (ref 19–32)
Calcium: 8.1 mg/dL — ABNORMAL LOW (ref 8.4–10.5)
Chloride: 128 mEq/L — ABNORMAL HIGH (ref 96–112)
Creatinine, Ser: 6.05 mg/dL — ABNORMAL HIGH (ref 0.50–1.10)
GFR calc Af Amer: 7 mL/min — ABNORMAL LOW (ref >90)
GFR, EST NON AFRICAN AMERICAN: 6 mL/min — AB (ref >90)
GLUCOSE: 144 mg/dL — AB (ref 70–99)
Potassium: 5.1 mEq/L (ref 3.7–5.3)
SODIUM: 155 meq/L — AB (ref 137–147)
Total Bilirubin: <0.2 mg/dL — ABNORMAL LOW (ref 0.3–1.2)
Total Protein: 5.7 g/dL — ABNORMAL LOW (ref 6.0–8.3)

## 2014-08-08 LAB — CBC WITH DIFFERENTIAL/PLATELET
Basophils Absolute: 0 10*3/uL (ref 0.0–0.1)
Basophils Relative: 0 % (ref 0–1)
Eosinophils Absolute: 0 10*3/uL (ref 0.0–0.7)
Eosinophils Relative: 1 % (ref 0–5)
HEMATOCRIT: 22.6 % — AB (ref 36.0–46.0)
HEMOGLOBIN: 7.3 g/dL — AB (ref 12.0–15.0)
LYMPHS PCT: 13 % (ref 12–46)
Lymphs Abs: 0.6 10*3/uL — ABNORMAL LOW (ref 0.7–4.0)
MCH: 29.3 pg (ref 26.0–34.0)
MCHC: 32.3 g/dL (ref 30.0–36.0)
MCV: 90.8 fL (ref 78.0–100.0)
MONO ABS: 0.1 10*3/uL (ref 0.1–1.0)
Monocytes Relative: 2 % — ABNORMAL LOW (ref 3–12)
Neutro Abs: 3.8 10*3/uL (ref 1.7–7.7)
Neutrophils Relative %: 84 % — ABNORMAL HIGH (ref 43–77)
PLATELETS: 164 10*3/uL (ref 150–400)
RBC: 2.49 MIL/uL — ABNORMAL LOW (ref 3.87–5.11)
RDW: 19.5 % — ABNORMAL HIGH (ref 11.5–15.5)
WBC: 4.5 10*3/uL (ref 4.0–10.5)

## 2014-08-08 LAB — CBG MONITORING, ED
GLUCOSE-CAPILLARY: 117 mg/dL — AB (ref 70–99)
Glucose-Capillary: 55 mg/dL — ABNORMAL LOW (ref 70–99)

## 2014-08-08 LAB — URINALYSIS, ROUTINE W REFLEX MICROSCOPIC
BILIRUBIN URINE: NEGATIVE
Glucose, UA: NEGATIVE mg/dL
Ketones, ur: NEGATIVE mg/dL
Nitrite: POSITIVE — AB
Specific Gravity, Urine: 1.011 (ref 1.005–1.030)
UROBILINOGEN UA: 0.2 mg/dL (ref 0.0–1.0)
pH: 6 (ref 5.0–8.0)

## 2014-08-08 LAB — I-STAT ARTERIAL BLOOD GAS, ED
Acid-base deficit: 12 mmol/L — ABNORMAL HIGH (ref 0.0–2.0)
BICARBONATE: 13.6 meq/L — AB (ref 20.0–24.0)
O2 SAT: 97 %
Patient temperature: 85.7
TCO2: 15 mmol/L (ref 0–100)
pCO2 arterial: 22.7 mmHg — ABNORMAL LOW (ref 35.0–45.0)
pH, Arterial: 7.348 — ABNORMAL LOW (ref 7.350–7.450)
pO2, Arterial: 69 mmHg — ABNORMAL LOW (ref 80.0–100.0)

## 2014-08-08 LAB — I-STAT TROPONIN, ED: Troponin i, poc: 0 ng/mL (ref 0.00–0.08)

## 2014-08-08 LAB — I-STAT CG4 LACTIC ACID, ED: Lactic Acid, Venous: 1.13 mmol/L (ref 0.5–2.2)

## 2014-08-08 MED ORDER — INSULIN ASPART 100 UNIT/ML ~~LOC~~ SOLN: 2.0000 [IU] | SUBCUTANEOUS | Status: DC

## 2014-08-08 MED ORDER — SODIUM CHLORIDE 0.9 % IV SOLN: 250.0000 mL | INTRAVENOUS | Status: DC | PRN

## 2014-08-08 MED ORDER — ROCURONIUM BROMIDE 50 MG/5ML IV SOLN
100.0000 mg | Freq: Once | INTRAVENOUS | Status: AC
  Administered 2014-08-08: 100 mg via INTRAVENOUS

## 2014-08-08 MED ORDER — DEXTROSE 5 % IV SOLN
2.0000 ug/min | Freq: Once | INTRAVENOUS | Status: AC
  Administered 2014-08-08: 5 ug/min via INTRAVENOUS
  Filled 2014-08-08: qty 4

## 2014-08-08 MED ORDER — MIDAZOLAM HCL 2 MG/2ML IJ SOLN
INTRAMUSCULAR | Status: AC
  Filled 2014-08-08: qty 4

## 2014-08-08 MED ORDER — SODIUM CHLORIDE 0.9 % IV BOLUS (SEPSIS)
1000.0000 mL | Freq: Once | INTRAVENOUS | Status: AC
  Administered 2014-08-08: 1000 mL via INTRAVENOUS

## 2014-08-08 MED ORDER — DEXTROSE 5 % IV SOLN
1.0000 g | Freq: Once | INTRAVENOUS | Status: AC
  Administered 2014-08-09: 1 g via INTRAVENOUS
  Filled 2014-08-08: qty 1

## 2014-08-08 MED ORDER — DEXTROSE 5 % IV SOLN
500.0000 mg | Freq: Three times a day (TID) | INTRAVENOUS | Status: AC
  Administered 2014-08-09 – 2014-08-14 (×18): 500 mg via INTRAVENOUS
  Filled 2014-08-08 (×19): qty 0.5

## 2014-08-08 MED ORDER — METRONIDAZOLE IN NACL 5-0.79 MG/ML-% IV SOLN
500.0000 mg | Freq: Once | INTRAVENOUS | Status: AC
  Administered 2014-08-08: 500 mg via INTRAVENOUS
  Filled 2014-08-08: qty 100

## 2014-08-08 MED ORDER — DEXTROSE 50 % IV SOLN
1.0000 | Freq: Once | INTRAVENOUS | Status: AC
  Administered 2014-08-08: 50 mL via INTRAVENOUS

## 2014-08-08 MED ORDER — SODIUM CHLORIDE 0.9 % IV SOLN
INTRAVENOUS | Status: DC
  Administered 2014-08-08 – 2014-08-09 (×2): via INTRAVENOUS

## 2014-08-08 MED ORDER — ETOMIDATE 2 MG/ML IV SOLN
20.0000 mg | Freq: Once | INTRAVENOUS | Status: AC
  Administered 2014-08-08: 20 mg via INTRAVENOUS

## 2014-08-08 MED ORDER — DEXTROSE 50 % IV SOLN
INTRAVENOUS | Status: AC
  Filled 2014-08-08: qty 50

## 2014-08-08 MED ORDER — IPRATROPIUM-ALBUTEROL 0.5-2.5 (3) MG/3ML IN SOLN
3.0000 mL | RESPIRATORY_TRACT | Status: DC | PRN
  Administered 2014-08-15 – 2014-08-16 (×2): 3 mL via RESPIRATORY_TRACT
  Filled 2014-08-08 (×2): qty 3

## 2014-08-08 MED ORDER — FENTANYL CITRATE 0.05 MG/ML IJ SOLN
INTRAMUSCULAR | Status: DC
Start: 2014-08-08 — End: 2014-08-09
  Filled 2014-08-08: qty 2

## 2014-08-08 MED ORDER — ASPIRIN 81 MG PO CHEW: 324.0000 mg | CHEWABLE_TABLET | ORAL | Status: AC

## 2014-08-08 MED ORDER — PANTOPRAZOLE SODIUM 40 MG IV SOLR
40.0000 mg | Freq: Every day | INTRAVENOUS | Status: DC
  Administered 2014-08-09 (×2): 40 mg via INTRAVENOUS
  Filled 2014-08-08 (×3): qty 40

## 2014-08-08 MED ORDER — ASPIRIN 300 MG RE SUPP
300.0000 mg | RECTAL | Status: AC
  Administered 2014-08-09: 300 mg via RECTAL
  Filled 2014-08-08: qty 1

## 2014-08-08 MED ORDER — VANCOMYCIN HCL 10 G IV SOLR
1250.0000 mg | Freq: Once | INTRAVENOUS | Status: AC
  Administered 2014-08-08: 1250 mg via INTRAVENOUS
  Filled 2014-08-08: qty 1250

## 2014-08-08 NOTE — ED Notes (Signed)
Per [facility] - pt w/ a fall while sitting on the toilet - EMS found pt w/ CBG of 27 - s/p D50 pt w/ CBG of 250 - EMS found pt w/ bradycardic rhythm 40bpm and began pacing pt in the field. Pt only responsive to painful stimuli - pt received versed IM and lidocaine en route. Pt w/ contusion and small laceration to rt eyelid.

## 2014-08-08 NOTE — H&P (Signed)
PULMONARY / CRITICAL CARE MEDICINE   Name: [name] MRN: [id number] DOB: [birth year]    ADMISSION DATE:  08/08/2014 CONSULTATION DATE:  08/08/2014  REFERRING MD :  EDP  CHIEF COMPLAINT:  AMS  INITIAL PRESENTATION: 68 year old female with DM and dementia found down at home. Hypoglycemic and bradycardic in field, transcutaneously paced. Intubated in ED for airway protection.   STUDIES:  CT head/c-spine 8/18: no acute intracranial process, Widened C4 - C5 facet. ? Septic joint  SIGNIFICANT EVENTS: 8/18 Fall from toilet, brady on pacer pads, intubated in ED. Admitted to ICU  HISTORY OF PRESENT ILLNESS:  68 year old female with PMH as below. She has dementia which, per family is pretty advanced at baseline. She requires assistance for all ADLs including walking, and is frequently confused. Family reports 8/15 she had some slurred and slowing of speech which resolved spontaneously. She was in her USOH after that. Then 8/18 she was walked to the toilet and left there for short period when family heard a thud. They entered bathroom to find her on floor with laceration above L eye. She was unresponsive. EMS was called and at their arrival she was found to be hypotensive and bradycardic with pulse in the 40s. She was given lidocaine, placed on transcutaneous pacer, and brought to the ED where she was intubated for airway protection. PCCM asked to see for admission.   PAST MEDICAL HISTORY :  Past Medical History  Diagnosis Date  . Hypertension   . DEMENTIA   . Diabetes mellitus   . Arthritis    Past Surgical History  Procedure Laterality Date  . Toe amputation      2 toes  . Ankle broken    . Clavicle surgery    . Abdominal hysterectomy    . Fracture surgery      ankle  . Cholecystectomy    . I&d extremity  05/14/2012    Procedure: IRRIGATION AND DEBRIDEMENT EXTREMITY;  Surgeon: Marlowe Shores, MD;  Location: [facility] OR;  Service: Orthopedics;  Laterality: Left;  . Orif finger  fracture  05/14/2012    Procedure: OPEN REDUCTION INTERNAL FIXATION (ORIF) METACARPAL (FINGER) FRACTURE;  Surgeon: Marlowe Shores, MD;  Location: [facility] OR;  Service: Orthopedics;  Laterality: Left;   Prior to Admission medications   Medication Sig Start Date End Date Taking? Authorizing Provider  ALPRAZolam (XANAX) 0.25 MG tablet Take 0.25 mg by mouth at bedtime as needed. For sleep   Yes Historical Provider, MD  amitriptyline (ELAVIL) 50 MG tablet Take 50 mg by mouth at bedtime.   Yes Historical Provider, MD  amLODipine (NORVASC) 10 MG tablet Take 10 mg by mouth daily.   Yes Historical Provider, MD  furosemide (LASIX) 80 MG tablet Take 80 mg by mouth 2 (two) times daily.   Yes Historical Provider, MD  insulin NPH (HUMULIN N,NOVOLIN N) 100 UNIT/ML injection Inject 10-20 Units into the skin 2 (two) times daily before a meal. 20 units in am and 10 units in pm   Yes Historical Provider, MD  levothyroxine (SYNTHROID, LEVOTHROID) 25 MCG tablet Take 25 mcg by mouth daily.   Yes Historical Provider, MD  metoprolol tartrate (LOPRESSOR) 25 MG tablet Take 25 mg by mouth 2 (two) times daily.   Yes Historical Provider, MD  Multiple Vitamins-Minerals (CENTRUM PO) Take 1 tablet by mouth daily.   Yes Historical Provider, MD   Allergies  Allergen Reactions  . Penicillins Anaphylaxis  . Vancomycin Rash    FAMILY  HISTORY:  History reviewed. No pertinent family history. SOCIAL HISTORY:  reports that she has never smoked. She does not have any smokeless tobacco history on file. She reports that she does not drink alcohol or use illicit drugs.  REVIEW OF SYSTEMS:  Unable - intubated  SUBJECTIVE:   VITAL SIGNS: Pulse Rate:  [71-78] 71 (08/18 2210) Resp:  [0-18] 0 (08/18 2210) BP: (60-142)/(50-80) 116/80 mmHg (08/18 2210) SpO2:  [96 %-100 %] 100 % (08/18 2210) FiO2 (%):  [50 %] 50 % (08/18 2045) Weight:  [58.968 kg (130 lb)] 58.968 kg (130 lb) (08/18 2045) HEMODYNAMICS:   VENTILATOR SETTINGS: Vent  Mode:  [-] PRVC FiO2 (%):  [50 %] 50 % Set Rate:  [18 bmp] 18 bmp Vt Set:  [400 mL] 400 mL PEEP:  [5 cmH20] 5 cmH20 Plateau Pressure:  [15 cmH20] 15 cmH20 INTAKE / OUTPUT: No intake or output data in the 24 hours ending 08/08/14 2230  PHYSICAL EXAMINATION: General:  Elderly female on ventilator Neuro:  Comatose, pupils sluggish  HEENT: [place], laceration above L eye. No JVD noted Cardiovascular:  Paced, SI S2, no MRG. Junctional rhythm on ECG when pacer turned off.  Lungs:  Scant rhonchi, resps even, synchronous with vent.  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  Skin:  Pale, intact.   LABS:  CBC  Recent Labs Lab 08/08/14 2120  WBC 4.5  HGB 7.3*  HCT 22.6*  PLT 164   Coag's No results found for this basename: APTT, INR,  in the last 168 hours BMET  Recent Labs Lab 08/08/14 2120  NA 155*  K 5.1  CL 128*  CO2 13*  BUN 123*  CREATININE 6.05*  GLUCOSE 144*   Electrolytes  Recent Labs Lab 08/08/14 2120  CALCIUM 8.1*   Sepsis Markers  Recent Labs Lab 08/08/14 2205  LATICACIDVEN 1.13   ABG No results found for this basename: PHART, PCO2ART, PO2ART,  in the last 168 hours Liver Enzymes  Recent Labs Lab 08/08/14 2120  AST 159*  ALT 176*  ALKPHOS 249*  BILITOT <0.2*  ALBUMIN 2.4*   Cardiac Enzymes No results found for this basename: TROPONINI, PROBNP,  in the last 168 hours Glucose  Recent Labs Lab 08/08/14 2047 08/08/14 2127  GLUCAP 55* 117*    Imaging No results found.   ASSESSMENT / PLAN:  PULMONARY OETT 8/18 A: Acute respiratory failure 2nd to encephalopathy P:   Full vent support for now, adjust vent for ABG. CXR in AM. PRN bronchodilators. SBT when more awake.  CARDIOVASCULAR CVL - 8/18 > A:  Shock hypovolemic vs septic Bradycardia ?2nd to hypothermia P:  Continue transcutaneous pacing Cardiology to see Aggressive IVF resuscitation MAP goal > 65 mm/Hg Trend lactic  acid Trend troponin  RENAL A:   Acute on CKD - baseline creat ~5  P:   IVF Consult renal Follow Bmet  GASTROINTESTINAL A:   Elevated transaminases GI ppx  P:   Trend LFT Check ammonia PPI NPO  HEMATOLOGIC A:   Anemia - acute on chronic Laceration to L eye - bleeding controlled  P:  Guaiac testing Follow CBC Transfuse per normal ICU guidelines Laceration to be sutured in ED   INFECTIOUS A:   Septic shock. UTI, Septic join C4-C5? Hypothermia  P:   BCx2 8/18 >> UC 8/18 >> Abx: Vancomycin (started in ED with no reaction), start date 8/18, day 0 Abx: Aztreonam, start date 8/18, day 0 Warming blanket Warm saline  Warmed humidified Oxygen Goal warming at rate of 1/2 degree celsius per hour.  ENDOCRINE A:   Hypoglycemia (resolved) DM Hypothyroidism  P:   CBG monitoring SSI Check cortisol Check TSH Dexamethasone 4mg  IV once  NEUROLOGIC A:   Acute encephalopathy ? Tricyclic antidepressant toxicity.  P:   RASS goal: -1 PRN versed and PRN fentanyl for sedation Holding home alprazolam and elavil  Global: Family updated at bedside and made aware of poor condition. They wish to go home and find living will prior to making any decisions on code status, so she will remain a full code at this time. Once they find living will they will contact CCM and discuss further wishes.   Joneen Roach, ACNP [facility] Pager [phone] or [phone]  Patient has a very poor functional status and will need to discuss code status once rest of family is available.  I have personally obtained a history, examined the patient, evaluated laboratory and imaging results, formulated the assessment and plan and placed orders.  CRITICAL CARE: The patient is critically ill with multiple organ systems failure and requires high complexity decision making for assessment and support, frequent evaluation and titration of therapies, application of advanced  monitoring technologies and extensive interpretation of multiple databases. Critical Care Time devoted to patient care services described in this note is 45 minutes.   Alyson Reedy, M.D. [facility]. Pager: [phone]. After hours pager: [phone].

## 2014-08-08 NOTE — Progress Notes (Signed)
ANTIBIOTIC CONSULT NOTE - INITIAL  Pharmacy Consult for Vancomycin/Aztreonam  Indication: rule out sepsis  Allergies  Allergen Reactions  . Penicillins Anaphylaxis  . Vancomycin Rash    Patient Measurements: Height: 5' 1.81" (157 cm) Weight: 130 lb (58.968 kg) IBW/kg (Calculated) : 49.67  Vital Signs: BP: 114/61 mmHg (08/18 2315) Pulse Rate: 72 (08/18 2315)  Labs:  Recent Labs  08/08/14 2120  WBC 4.5  HGB 7.3*  PLT 164  CREATININE 6.05*   Estimated Creatinine Clearance: 6.9 ml/min (by C-G formula based on Cr of 6.05).   Medical History: Past Medical History  Diagnosis Date  . Hypertension   . DEMENTIA   . Diabetes mellitus   . Arthritis     Assessment: 68 y/o F here s/p fall, acute on chronic kidney disease (baseline Scr ~5), WBC wnl, other labs as above. Already received vancomycin 1250 mg IV x 1 and aztreonam 1g IV x 1 in the ED. Noted rash with vancomycin, ?redman reaction, monitor.   Goal of Therapy:  Vancomycin trough level 15-20 mcg/ml  Plan:  -Check vancomycin random 8/20 AM -Aztreonam 500 mg IV q8h -Trend WBC, temp, renal function  -Drug levels as indicated   Abran DukeLedford, Derian Pfost 08/08/2014,11:40 PM

## 2014-08-08 NOTE — ED Notes (Signed)
Cleaned wound over left eye.  Wound continues to bleed.

## 2014-08-08 NOTE — ED Provider Notes (Signed)
CSN: [id number]     Arrival date & time 08/08/14  2025 History   First MD Initiated Contact with Patient 08/08/14 2122     Chief Complaint  Patient presents with  . Bradycardia  . Hypoglycemia     (Consider location/radiation/quality/duration/timing/severity/associated sxs/prior Treatment) HPI  [name] is a 68 y.o. female with a past medical history of hypertension, dementia, diabetes, and arthritis, presenting for altered mental status, bradycardia, hypoglycemia, and fall. Per EMS report patient was her normal self approximately 1 hour prior to arrival. Patient was using the restroom, and family heard her fall in the bathroom. Upon arrival of EMS patient was noted to have a blood sugar of 27 and was responding only to deep painful stimuli. One amp of D50 was administered. Patient had a repeat blood sugar in the 200s, however did not regain responsiveness. Patient also was bradycardic in the 40s.  EMS started patient on cardiac pacing and blood pressures were maintained at 120s systolic in route. Patient continued to remained responsive only to deep painful stimuli. Per later discussion with patient's daughter, following initial stabilization efforts. Patient had not been endorsing any recent complaints of constitutional symptoms such as fever, chills, chest pain, shortness of breath, sore throat, dysuria, cough, or other associated symptoms. Patient had seemed intermittently more confused over the past 2 days however. But was otherwise her normal self today prior to her acute illness.     Past Medical History  Diagnosis Date  . Hypertension   . DEMENTIA   . Diabetes mellitus   . Arthritis    Past Surgical History  Procedure Laterality Date  . Toe amputation      2 toes  . Ankle broken    . Clavicle surgery    . Abdominal hysterectomy    . Fracture surgery      ankle  . Cholecystectomy    . I&d extremity  05/14/2012    Procedure: IRRIGATION AND DEBRIDEMENT EXTREMITY;   Surgeon: Marlowe Shores, MD;  Location: [facility] OR;  Service: Orthopedics;  Laterality: Left;  . Orif finger fracture  05/14/2012    Procedure: OPEN REDUCTION INTERNAL FIXATION (ORIF) METACARPAL (FINGER) FRACTURE;  Surgeon: Marlowe Shores, MD;  Location: [facility] OR;  Service: Orthopedics;  Laterality: Left;   History reviewed. No pertinent family history. History  Substance Use Topics  . Smoking status: Never Smoker   . Smokeless tobacco: Not on file  . Alcohol Use: No   OB History   Grav Para Term Preterm Abortions TAB SAB Ect Mult Living                 Review of Systems  Unable to perform ROS: Mental status change      Allergies  Penicillins and Vancomycin  Home Medications   Prior to Admission medications   Medication Sig Start Date End Date Taking? Authorizing Provider  ALPRAZolam (XANAX) 0.25 MG tablet Take 0.25 mg by mouth at bedtime as needed. For sleep   Yes Historical Provider, MD  amitriptyline (ELAVIL) 50 MG tablet Take 50 mg by mouth at bedtime.   Yes Historical Provider, MD  amLODipine (NORVASC) 10 MG tablet Take 10 mg by mouth daily.   Yes Historical Provider, MD  furosemide (LASIX) 80 MG tablet Take 80 mg by mouth 2 (two) times daily.   Yes Historical Provider, MD  insulin NPH (HUMULIN N,NOVOLIN N) 100 UNIT/ML injection Inject 10-20 Units into the skin 2 (two) times daily before a meal. 20 units in  am and 10 units in pm   Yes Historical Provider, MD  levothyroxine (SYNTHROID, LEVOTHROID) 25 MCG tablet Take 25 mcg by mouth daily.   Yes Historical Provider, MD  metoprolol tartrate (LOPRESSOR) 25 MG tablet Take 25 mg by mouth 2 (two) times daily.   Yes Historical Provider, MD  Multiple Vitamins-Minerals (CENTRUM PO) Take 1 tablet by mouth daily.   Yes Historical Provider, MD   BP 119/73  Pulse 72  Resp 18  Wt 130 lb (58.968 kg)  SpO2 100% Physical Exam  Nursing note and vitals reviewed. Constitutional: She appears well-developed. She appears toxic. She appears  distressed.  HENT:  Head: Normocephalic. Head is with contusion and with laceration.    Right Ear: External ear normal.  Left Ear: External ear normal.  Nose: Nose normal.  Mouth/Throat: Oropharynx is clear and moist. No oropharyngeal exudate.  Eyes: Conjunctivae and EOM are normal. Pupils are equal, round, and reactive to light. Right eye exhibits no discharge. Left eye exhibits no discharge. No scleral icterus.  Neck: Normal range of motion. Neck supple. No JVD present. No tracheal deviation present. No thyromegaly present.  Cardiovascular: Regular rhythm, normal heart sounds and intact distal pulses.  Bradycardia present.  Exam reveals no gallop and no friction rub.   No murmur heard. Pulmonary/Chest: Effort normal and breath sounds normal. No stridor. No respiratory distress. She has no wheezes. She has no rales. She exhibits no tenderness.  Abdominal: Soft. She exhibits no distension.  Musculoskeletal: She exhibits edema.  Lymphadenopathy:    She has no cervical adenopathy.  Neurological: She is unresponsive. GCS eye subscore is 2. GCS verbal subscore is 1. GCS motor subscore is 2.  Pt only responding to deep painful stimuli upon arrival, such as airway maneuvers.  Will open eyes and extend.  No purposeful movement or ability to follow commands.  Skin: Skin is warm and dry. No rash noted. She is not diaphoretic. No erythema. No pallor.  Psychiatric:  Unable to assess.    ED Course  INTUBATION Date/Time: 08/08/2014 8:45 PM Performed by: Gavin PoundBROOTEN, Amber Williard Authorized by: Richardean CanalYAO, DAVID H Consent: The procedure was performed in an emergent situation. Required items: required blood products, implants, devices, and special equipment available Patient identity confirmed: arm band Indications: airway protection Intubation method: direct Patient status: paralyzed (RSI) Preoxygenation: BVM Sedatives: etomidate Paralytic: rocuronium Laryngoscope size: Mac 3 Tube size: 7.0 mm Tube type:  cuffed Number of attempts: 2 Ventilation between attempts: BVM Cords visualized: yes Post-procedure assessment: chest rise and ETCO2 monitor Breath sounds: equal Cuff inflated: yes ETT to lip: 22 cm Tube secured with: ETT holder Chest x-ray interpreted by radiologist. Chest x-ray findings: endotracheal tube in appropriate position Patient tolerance: Patient tolerated the procedure well with no immediate complications.  LACERATION REPAIR Date/Time: 08/09/2014 12:20 AM Performed by: Gavin PoundBROOTEN, Akif Weldy Authorized by: Richardean CanalYAO, DAVID H Consent given by: Pt intubated and unable to consent.  Lac repair for bleeding control and wound mgt. Required items: required blood products, implants, devices, and special equipment available Patient identity confirmed: arm band Body area: head/neck Location details: left eyebrow Laceration length: 2 cm Foreign bodies: no foreign bodies Tendon involvement: none Nerve involvement: none Vascular damage: no Anesthesia: local infiltration Local anesthetic: lidocaine 2% with epinephrine Anesthetic total: 2 ml Patient sedated: no Preparation: Patient was prepped and draped in the usual sterile fashion. Irrigation solution: saline Irrigation method: jet lavage Amount of cleaning: standard Debridement: none Degree of undermining: none Wound skin closure material used: 5-0 Monocryl. Wound subcutaneous  closure material used: 5-0 Monocryl. Number of sutures: 5 Technique: simple Approximation: close Approximation difficulty: complex Patient tolerance: Patient tolerated the procedure well with no immediate complications.     CENTRAL LINE Performed by: Gavin Pound Authorized by: Richardean Canal Consent: The procedure was performed in an emergent situation. Required items: required blood products, implants, devices, and special equipment available Patient identity confirmed: arm band and provided demographic data Time out: Immediately prior to procedure a  "time out" was called to verify the correct patient, procedure, equipment, support staff and site/side marked as required. Indications: vascular access Patient sedated: no Preparation: skin prepped with ChloraPrep Skin prep agent dried: skin prep agent completely dried prior to procedure Sterile barriers: all five maximum sterile barriers used - cap, mask, sterile gown, sterile gloves, and large sterile sheet Hand hygiene: hand hygiene performed prior to central venous catheter insertion  Location details: R Femoral, due to emergent placement for hypotension and pressor requirement, Pt in C-collar  Catheter type: triple lumen Catheter size: 8 Fr Pre-procedure: landmarks identified Ultrasound guidance: yes Successful placement: yes Post-procedure: line sutured and dressing applied Assessment: blood return through all parts, free fluid flow, placement verified by x-ray Patient tolerance: Patient tolerated the procedure well with no immediate complications. ent(including critical care time)   Labs Review Labs Reviewed  CBC WITH DIFFERENTIAL - Abnormal; Notable for the following:    RBC 2.49 (*)    Hemoglobin 7.3 (*)    HCT 22.6 (*)    RDW 19.5 (*)    Neutrophils Relative % 84 (*)    Lymphs Abs 0.6 (*)    Monocytes Relative 2 (*)    All other components within normal limits  COMPREHENSIVE METABOLIC PANEL - Abnormal; Notable for the following:    Sodium 155 (*)    Chloride 128 (*)    CO2 13 (*)    Glucose, Bld 144 (*)    BUN 123 (*)    Creatinine, Ser 6.05 (*)    Calcium 8.1 (*)    Total Protein 5.7 (*)    Albumin 2.4 (*)    AST 159 (*)    ALT 176 (*)    Alkaline Phosphatase 249 (*)    Total Bilirubin <0.2 (*)    GFR calc non Af Amer 6 (*)    GFR calc Af Amer 7 (*)    All other components within normal limits  CBG MONITORING, ED - Abnormal; Notable for the following:    Glucose-Capillary 55 (*)    All other components within normal limits  CBG MONITORING, ED - Abnormal;  Notable for the following:    Glucose-Capillary 117 (*)    All other components within normal limits  CULTURE, BLOOD (ROUTINE X 2)  CULTURE, BLOOD (ROUTINE X 2)  URINE CULTURE  URINALYSIS, ROUTINE W REFLEX MICROSCOPIC  I-STAT CG4 LACTIC ACID, ED  I-STAT TROPOININ, ED    Imaging Review Ct Head Wo Contrast  08/08/2014   CLINICAL DATA:  Larey Seat.  Hit head.  EXAM: CT HEAD WITHOUT CONTRAST  CT CERVICAL SPINE WITHOUT CONTRAST  TECHNIQUE: Multidetector CT imaging of the head and cervical spine was performed following the standard protocol without intravenous contrast. Multiplanar CT image reconstructions of the cervical spine were also generated.  COMPARISON:  Head CT 11/14/2012  FINDINGS: CT HEAD FINDINGS  Stable age related cerebral atrophy, ventriculomegaly and periventricular white matter disease. No extra-axial fluid collections are identified. No CT findings for acute hemispheric infarction or intracranial hemorrhage. No mass lesions. The brainstem  and cerebellum are normal.  No acute fracture is identified. Extensive ethmoid sinus disease. Moderate fluid in the posterior nasopharynx likely due to a endotracheal tube or NG tube. The mastoid air cells and middle ear cavities are clear.  CT CERVICAL SPINE FINDINGS  The cervical vertebral bodies are normally aligned. No acute fracture. An endotracheal tube is noted. Abnormal prevertebral/ retropharyngeal soft tissue swelling likely due to the endotracheal tube. No acute cervical spine fracture is identified. The C4-5 facet on the left is markedly widened. This could be due to facet arthropathy. Could not exclude the possibility of a septic joint. MRI may be helpful for further evaluation when able.  Extensive carotid artery calcifications are noted. The lung apices are grossly clear.  IMPRESSION: 1. No acute intracranial findings. Age related cerebral atrophy, ventriculomegaly and periventricular white matter disease are noted. 2. No acute skull fracture. 3.  Normal alignment of the cervical vertebral bodies and no acute fracture. 4. Widened C4-5 facet joint on the left could be due to facet arthropathy. Could not exclude a septic joint. MRI may be helpful for further evaluation (without and with contrast) when able.   Electronically Signed   By: Loralie Champagne M.D.   On: 08/08/2014 22:31   Ct Cervical Spine Wo Contrast  08/08/2014   CLINICAL DATA:  Larey Seat.  Hit head.  EXAM: CT HEAD WITHOUT CONTRAST  CT CERVICAL SPINE WITHOUT CONTRAST  TECHNIQUE: Multidetector CT imaging of the head and cervical spine was performed following the standard protocol without intravenous contrast. Multiplanar CT image reconstructions of the cervical spine were also generated.  COMPARISON:  Head CT 11/14/2012  FINDINGS: CT HEAD FINDINGS  Stable age related cerebral atrophy, ventriculomegaly and periventricular white matter disease. No extra-axial fluid collections are identified. No CT findings for acute hemispheric infarction or intracranial hemorrhage. No mass lesions. The brainstem and cerebellum are normal.  No acute fracture is identified. Extensive ethmoid sinus disease. Moderate fluid in the posterior nasopharynx likely due to a endotracheal tube or NG tube. The mastoid air cells and middle ear cavities are clear.  CT CERVICAL SPINE FINDINGS  The cervical vertebral bodies are normally aligned. No acute fracture. An endotracheal tube is noted. Abnormal prevertebral/ retropharyngeal soft tissue swelling likely due to the endotracheal tube. No acute cervical spine fracture is identified. The C4-5 facet on the left is markedly widened. This could be due to facet arthropathy. Could not exclude the possibility of a septic joint. MRI may be helpful for further evaluation when able.  Extensive carotid artery calcifications are noted. The lung apices are grossly clear.  IMPRESSION: 1. No acute intracranial findings. Age related cerebral atrophy, ventriculomegaly and periventricular white  matter disease are noted. 2. No acute skull fracture. 3. Normal alignment of the cervical vertebral bodies and no acute fracture. 4. Widened C4-5 facet joint on the left could be due to facet arthropathy. Could not exclude a septic joint. MRI may be helpful for further evaluation (without and with contrast) when able.   Electronically Signed   By: Loralie Champagne M.D.   On: 08/08/2014 22:31   Dg Pelvis Portable  08/08/2014   CLINICAL DATA:  Central line placement.  EXAM: PORTABLE PELVIS 1-2 VIEWS  COMPARISON:  None.  FINDINGS: There is a right femoral central line in good position without complicating features. The hips are normally located. No obvious pelvic fractures.  IMPRESSION: No acute bony findings.  Right femoral catheter in place.   Electronically Signed   By: Loralie Champagne  M.D.   On: 08/08/2014 21:45   Dg Chest Port 1 View  08/09/2014   CLINICAL DATA:  Intubation.  EXAM: PORTABLE CHEST - 1 VIEW  COMPARISON:  06/08/2014.  FINDINGS: Endotracheal tube and NG tube in good anatomic position. Cardiomegaly, stable. Pulmonary vascularity is normal. Low lung volumes with bibasilar atelectasis and/or mild infiltrate. Small all pleural effusions cannot be excluded. No pneumothorax. No acute osseous abnormality. Old posttraumatic deformity right clavicle.  IMPRESSION: 1. Line tubes in good anatomic position. Interim placement of NG tube. Its tip is below left hemidiaphragm. 2. Stable cardiomegaly with normal pulmonary vascularity. 3. Mild bibasilar atelectasis and/or infiltrates. Small pleural effusions cannot be excluded . 4. Old posttraumatic deformity right clavicle.   Electronically Signed   By: Maisie Fus  Register   On: 08/09/2014 07:45   Dg Chest Port 1 View  08/08/2014   CLINICAL DATA:  Bradycardia.  Hypoglycemia.  EXAM: PORTABLE CHEST - 1 VIEW  COMPARISON:  08/02/2011  FINDINGS: Endotracheal tube is in good position 3 cm above the carina.  There is chronic cardiomegaly. Pulmonary vascularity is normal  and the lungs are clear. No acute osseous abnormality. 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  IMPRESSION: Endotracheal tube in good position.  Chronic cardiomegaly.   Electronically Signed   By: Geanie Cooley M.D.   On: 08/08/2014 21:32     EKG Interpretation   Date/Time:  Tuesday August 08 2014 21:19:31 EDT Ventricular Rate:  40 PR Interval:    QRS Duration: 94 QT Interval:  496 QTC Calculation: 404 R Axis:   59 Text Interpretation:  Junctional rhythm Abnormal T, consider ischemia,  lateral leads junctional rhythm new since previous  Confirmed by YAO  MD,  DAVID ([id number]) on 08/08/2014 9:25:27 PM      MDM   Final diagnoses:  Shock  Bradycardia  Anemia, unspecified anemia type  Type 2 diabetes mellitus with diabetic chronic kidney disease  Acute respiratory failure with hypoxia  Acute respiratory failure, unspecified whether with hypoxia or hypercapnia  Coma, unspecified coma depth   Upon arrival patient noted to be unresponsive except to deep stimuli and and only intermittently at best.  Decision made to intubate for airway protection. Initial attempt made without sedation due to patient's level of consciousness and urgent need for airway protection. Patient did require RSI as she coughed with laryngoscopy in attempt to place ET tube initially. Patient intubated under RSI, see MAR for further details.  Patient placed on intermittent sedation due to hypotension.  Following airway stabilization, patient continued to get more hypotensive despite adequate pacing capture. Decision made to start peripheral levophed, with central line placement. Patient's pressures improved on levophed infusion. Following emergent resuscitative efforts, temperature obtained in the mid 80s Fahrenheit. Confirmed by both rectal and Foley thermometer. Foley thermometer intermittently unable to obtain measurements. Active rewarming initiated. Patient started on broad-spectrum antibiotic therapy for  presumed sepsis. CT of the head obtained following initial stabilization. No signs of acute intracranial hemorrhage to account for patient's symptoms. Patient has numerous laboratory abnormalities consistent with AKI, and sepsis. Signs of urinary tract infection on urinalysis.  Patient admitted to critical care service for further management.    Patient care was discussed with my attending, Dr. Silverio Lay.     Gavin Pound, MD 08/10/14 [phone]

## 2014-08-09 ENCOUNTER — Inpatient Hospital Stay (HOSPITAL_COMMUNITY): Payer: Medicare Other

## 2014-08-09 DIAGNOSIS — R4182 Altered mental status, unspecified: Secondary | ICD-10-CM | POA: Diagnosis present

## 2014-08-09 DIAGNOSIS — J9600 Acute respiratory failure, unspecified whether with hypoxia or hypercapnia: Secondary | ICD-10-CM | POA: Diagnosis present

## 2014-08-09 LAB — GLUCOSE, CAPILLARY
GLUCOSE-CAPILLARY: 72 mg/dL (ref 70–99)
GLUCOSE-CAPILLARY: 66 mg/dL — AB (ref 70–99)
Glucose-Capillary: 90 mg/dL (ref 70–99)
Glucose-Capillary: 83 mg/dL (ref 70–99)
Glucose-Capillary: 61 mg/dL — ABNORMAL LOW (ref 70–99)

## 2014-08-09 LAB — ABO/RH: ABO/RH(D): B NEG

## 2014-08-09 LAB — CBG MONITORING, ED: GLUCOSE-CAPILLARY: 85 mg/dL (ref 70–99)

## 2014-08-09 LAB — MRSA PCR SCREENING: MRSA by PCR: POSITIVE — AB

## 2014-08-09 LAB — PROCALCITONIN: Procalcitonin: <0.1 ng/mL

## 2014-08-09 LAB — T3, FREE: T3 FREE: 1.8 pg/mL — AB (ref 2.3–4.2)

## 2014-08-09 LAB — TROPONIN I
Troponin I: <0.3 ng/mL (ref <0.30)
Troponin I: <0.3 ng/mL (ref <0.30)

## 2014-08-09 LAB — LACTIC ACID, PLASMA
Lactic Acid, Venous: 0.7 mmol/L (ref 0.5–2.2)
Lactic Acid, Venous: 0.8 mmol/L (ref 0.5–2.2)

## 2014-08-09 LAB — T4, FREE: Free T4: 0.6 ng/dL — ABNORMAL LOW (ref 0.80–1.80)

## 2014-08-09 LAB — TSH: TSH: 9.75 u[IU]/mL — ABNORMAL HIGH (ref 0.350–4.500)

## 2014-08-09 LAB — T3: T3, Total: 68.7 ng/dl — ABNORMAL LOW (ref 80.0–204.0)

## 2014-08-09 MED ORDER — LEVOTHYROXINE SODIUM 100 MCG IV SOLR
25.0000 ug | Freq: Every day | INTRAVENOUS | Status: DC
  Administered 2014-08-09 – 2014-08-12 (×4): 25 ug via INTRAVENOUS
  Filled 2014-08-09 (×5): qty 5

## 2014-08-09 MED ORDER — FENTANYL CITRATE 0.05 MG/ML IJ SOLN
50.0000 ug | INTRAMUSCULAR | Status: DC | PRN
  Filled 2014-08-09: qty 2

## 2014-08-09 MED ORDER — DEXTROSE 50 % IV SOLN: 50.0000 mL | Freq: Once | INTRAVENOUS | Status: DC

## 2014-08-09 MED ORDER — MIDAZOLAM HCL 2 MG/2ML IJ SOLN: 1.0000 mg | INTRAMUSCULAR | Status: DC | PRN

## 2014-08-09 MED ORDER — DEXTROSE 50 % IV SOLN
INTRAVENOUS | Status: AC
  Administered 2014-08-09: 50 mL
  Filled 2014-08-09: qty 50

## 2014-08-09 MED ORDER — DEXTROSE 50 % IV SOLN
50.0000 mL | Freq: Once | INTRAVENOUS | Status: AC | PRN
  Administered 2014-08-09: 50 mL via INTRAVENOUS

## 2014-08-09 MED ORDER — SODIUM CHLORIDE 0.9 % IV BOLUS (SEPSIS): 1000.0000 mL | INTRAVENOUS | Status: DC | PRN

## 2014-08-09 MED ORDER — VITAL HIGH PROTEIN PO LIQD
1000.0000 mL | ORAL | Status: DC
  Administered 2014-08-09 – 2014-08-10 (×3)
  Administered 2014-08-10: 1000 mL
  Administered 2014-08-10: 06:00:00
  Administered 2014-08-10 – 2014-08-14 (×5): 1000 mL
  Administered 2014-08-14: 09:00:00
  Filled 2014-08-09 (×8): qty 1000

## 2014-08-09 MED ORDER — CETYLPYRIDINIUM CHLORIDE 0.05 % MT LIQD
7.0000 mL | Freq: Four times a day (QID) | OROMUCOSAL | Status: DC
  Administered 2014-08-09 – 2014-08-15 (×25): 7 mL via OROMUCOSAL

## 2014-08-09 MED ORDER — NOREPINEPHRINE BITARTRATE 1 MG/ML IV SOLN
5.0000 ug/min | INTRAVENOUS | Status: DC
  Administered 2014-08-09: 5 ug/min via INTRAVENOUS
  Filled 2014-08-09: qty 4

## 2014-08-09 MED ORDER — CHLORHEXIDINE GLUCONATE 0.12 % MT SOLN
15.0000 mL | Freq: Two times a day (BID) | OROMUCOSAL | Status: DC
  Administered 2014-08-09 – 2014-08-15 (×13): 15 mL via OROMUCOSAL
  Filled 2014-08-09 (×12): qty 15

## 2014-08-09 MED ORDER — FENTANYL CITRATE 0.05 MG/ML IJ SOLN
50.0000 ug | INTRAMUSCULAR | Status: DC | PRN
  Administered 2014-08-11 – 2014-08-13 (×7): 50 ug via INTRAVENOUS
  Filled 2014-08-09 (×8): qty 2

## 2014-08-09 NOTE — Progress Notes (Signed)
Vent circuit changed to heated wire circuit per order.  Heater operation checked, no complications during transition.

## 2014-08-09 NOTE — Progress Notes (Addendum)
eLink Physician-Brief Progress Note Patient Name: [name] DOB: 02/20/1946 MRN: [id number]   Date of Service  08/09/2014  HPI/Events of Note  1. D/w cards fellow - patient is in NSR now. Patient seen by Dr Algie CofferKadakia prior so fellow will not do consult  2. SEpsis   Recent Labs Lab 08/09/14 0025  PROCALCITON <0.10   PCT is low  3. TSH is 9.5 - not full c/w myxedema coma  4. Cortisol pending - on dex  eICU Interventions  Continue support Start IV synthroid @ low dose for now; hold off on T3 Cont dex     Intervention Category Intermediate Interventions: Diagnostic test evaluation;Communication with other healthcare providers and/or family  Donnella Morford 08/09/2014, 4:29 AM

## 2014-08-09 NOTE — Progress Notes (Signed)
CCM notified that the pt has had emesis x2 and that as a result, TF are on hold and OGT is to intermittent suction.  TF will be restarted later by night shift.  Advised that I was concerned that the pt had aspirated.

## 2014-08-09 NOTE — Progress Notes (Signed)
Utilization Review Completed.[name] T8/19/2015  

## 2014-08-09 NOTE — Progress Notes (Signed)
INITIAL NUTRITION ASSESSMENT  DOCUMENTATION CODES Per approved criteria  -Not Applicable   INTERVENTION: Initiate Vital HP formula at 20 ml/hr and increase by 10 ml every 4 hours to goal rate of 60 ml/hr to provide 1440 kcals, 126 gm protein, 1168 ml of free water RD to follow for nutrition care plan  NUTRITION DIAGNOSIS: Inadequate oral intake related to inability to eat as evidenced by NPO status  Goal: Pt to meet >/= 90% of their estimated nutrition needs   Monitor:  TF regimen & tolerance, respiratory status, weight, labs, I/O's  Reason for Assessment: Consult  68 y.o. female  Admitting Dx: AMS  ASSESSMENT: 68 year old Female with DM and dementia found down at home. Hypoglycemic and bradycardic in field, transcutaneously paced. Intubated in ED for airway protection.  Patient is currently intubated on ventilator support -- OGT in place MV: 7.9 L/min Temp (24hrs), Avg:96.3 F (35.7 C), Min:94 F (34.4 C), Max:98.6 F (37 C)   Noted patient with significant weight loss since January 2015.  RD spoke with pt's daughter via telephone who reported patient had been morbidly obese at one point in time.  She states patient has been very successful with her weight loss, making healthier food/meal choices.    No muscle or subcutaneous fat depletion noticed.  RD consulted for TF initiation & management.  Height: Ht Readings from Last 1 Encounters:  08/08/14 5' 1.81" (1.57 m)    Weight: Wt Readings from Last 1 Encounters:  08/09/14 161 lb 13.1 oz (73.4 kg)    Ideal Body Weight: 105 lb  % Ideal Body Weight: 153%  Wt Readings from Last 10 Encounters:  08/09/14 161 lb 13.1 oz (73.4 kg)  01/12/14 185 lb (83.915 kg)    Usual Body Weight: 185 lb -- January 2015  % Usual Body Weight: 87%  BMI:  Body mass index is 29.78 kg/(m^2).  Estimated Nutritional Needs: Kcal: 1400-1550 Protein: 110-125 gm Fluid: per MD  Skin: Intact  Diet Order: NPO  EDUCATION  NEEDS: -No education needs identified at this time   Intake/Output Summary (Last 24 hours) at 08/09/14 1035 Last data filed at 08/09/14 1000  Gross per 24 hour  Intake 3998.93 ml  Output    405 ml  Net 3593.93 ml    Labs:   Recent Labs Lab 08/08/14 2120  NA 155*  K 5.1  CL 128*  CO2 13*  BUN 123*  CREATININE 6.05*  CALCIUM 8.1*  GLUCOSE 144*    CBG (last 3)   Recent Labs  08/09/14 0254 08/09/14 0332 08/09/14 0732  GLUCAP 90 83 61*    Scheduled Meds: . antiseptic oral rinse  7 mL Mouth Rinse QID  . aztreonam  500 mg Intravenous 3 times per day  . chlorhexidine  15 mL Mouth Rinse BID  . insulin aspart  2-6 Units Subcutaneous 6 times per day  . levothyroxine  25 mcg Intravenous Daily  . pantoprazole (PROTONIX) IV  40 mg Intravenous QHS    Continuous Infusions: . sodium chloride 100 mL/hr at 08/09/14 1000  . norepinephrine (LEVOPHED) Adult infusion Stopped (08/09/14 0530)    Past Medical History  Diagnosis Date  . Hypertension   . DEMENTIA   . Diabetes mellitus   . Arthritis     Past Surgical History  Procedure Laterality Date  . Toe amputation      2 toes  . Ankle broken    . Clavicle surgery    . Abdominal hysterectomy    .  Fracture surgery      ankle  . Cholecystectomy    . I&d extremity  05/14/2012    Procedure: IRRIGATION AND DEBRIDEMENT EXTREMITY;  Surgeon: Marlowe Shores, MD;  Location: [facility] OR;  Service: Orthopedics;  Laterality: Left;  . Orif finger fracture  05/14/2012    Procedure: OPEN REDUCTION INTERNAL FIXATION (ORIF) METACARPAL (FINGER) FRACTURE;  Surgeon: Marlowe Shores, MD;  Location: [facility] OR;  Service: Orthopedics;  Laterality: Left;    Maureen Chatters, RD, LDN Pager #: [phone] After-Hours Pager #: [phone]

## 2014-08-10 ENCOUNTER — Inpatient Hospital Stay (HOSPITAL_COMMUNITY): Payer: Medicare Other

## 2014-08-10 ENCOUNTER — Encounter (HOSPITAL_COMMUNITY): Payer: Self-pay | Admitting: General Practice

## 2014-08-10 DIAGNOSIS — T68XXXA Hypothermia, initial encounter: Secondary | ICD-10-CM | POA: Diagnosis present

## 2014-08-10 DIAGNOSIS — E039 Hypothyroidism, unspecified: Secondary | ICD-10-CM | POA: Diagnosis present

## 2014-08-10 DIAGNOSIS — A419 Sepsis, unspecified organism: Secondary | ICD-10-CM | POA: Diagnosis present

## 2014-08-10 DIAGNOSIS — J982 Interstitial emphysema: Secondary | ICD-10-CM

## 2014-08-10 DIAGNOSIS — R578 Other shock: Secondary | ICD-10-CM

## 2014-08-10 DIAGNOSIS — Z5189 Encounter for other specified aftercare: Secondary | ICD-10-CM

## 2014-08-10 DIAGNOSIS — F0390 Unspecified dementia without behavioral disturbance: Secondary | ICD-10-CM | POA: Diagnosis present

## 2014-08-10 DIAGNOSIS — F0391 Unspecified dementia with behavioral disturbance: Secondary | ICD-10-CM

## 2014-08-10 DIAGNOSIS — R571 Hypovolemic shock: Secondary | ICD-10-CM | POA: Diagnosis present

## 2014-08-10 DIAGNOSIS — E1129 Type 2 diabetes mellitus with other diabetic kidney complication: Secondary | ICD-10-CM | POA: Diagnosis present

## 2014-08-10 DIAGNOSIS — F03918 Unspecified dementia, unspecified severity, with other behavioral disturbance: Secondary | ICD-10-CM

## 2014-08-10 DIAGNOSIS — R6521 Severe sepsis with septic shock: Secondary | ICD-10-CM

## 2014-08-10 DIAGNOSIS — N390 Urinary tract infection, site not specified: Secondary | ICD-10-CM | POA: Diagnosis present

## 2014-08-10 DIAGNOSIS — R4020 Unspecified coma: Secondary | ICD-10-CM

## 2014-08-10 DIAGNOSIS — R6520 Severe sepsis without septic shock: Secondary | ICD-10-CM

## 2014-08-10 LAB — BLOOD GAS, ARTERIAL
ACID-BASE DEFICIT: 11.6 mmol/L — AB (ref 0.0–2.0)
Acid-base deficit: 11.5 mmol/L — ABNORMAL HIGH (ref 0.0–2.0)
Bicarbonate: 13.7 mEq/L — ABNORMAL LOW (ref 20.0–24.0)
Bicarbonate: 14.2 mEq/L — ABNORMAL LOW (ref 20.0–24.0)
DRAWN BY: 41881
DRAWN BY: 249101
FIO2: 0.4 %
FIO2: 0.4 %
LHR: 16 {breaths}/min
LHR: 12 {breaths}/min
O2 Saturation: 87.7 %
O2 Saturation: 97.1 %
PATIENT TEMPERATURE: 97.7
PCO2 ART: 28.8 mmHg — AB (ref 35.0–45.0)
PCO2 ART: 32.9 mmHg — AB (ref 35.0–45.0)
PEEP: 5 cmH2O
PEEP: 5 cmH2O
PH ART: 7.257 — AB (ref 7.350–7.450)
Patient temperature: 98.6
TCO2: 14.6 mmol/L (ref 0–100)
TCO2: 15.2 mmol/L (ref 0–100)
VT: 400 mL
VT: 400 mL
pH, Arterial: 7.297 — ABNORMAL LOW (ref 7.350–7.450)
pO2, Arterial: 55 mmHg — ABNORMAL LOW (ref 80.0–100.0)
pO2, Arterial: 146 mmHg — ABNORMAL HIGH (ref 80.0–100.0)

## 2014-08-10 LAB — BASIC METABOLIC PANEL
ANION GAP: 13 (ref 5–15)
BUN: 105 mg/dL — AB (ref 6–23)
BUN: 106 mg/dL — ABNORMAL HIGH (ref 6–23)
CALCIUM: 8.4 mg/dL (ref 8.4–10.5)
CALCIUM: 8.2 mg/dL — AB (ref 8.4–10.5)
CO2: 14 mEq/L — ABNORMAL LOW (ref 19–32)
CO2: 13 meq/L — AB (ref 19–32)
Chloride: >130 mEq/L (ref 96–112)
Chloride: 127 mEq/L — ABNORMAL HIGH (ref 96–112)
Creatinine, Ser: 6.11 mg/dL — ABNORMAL HIGH (ref 0.50–1.10)
Creatinine, Ser: 5.9 mg/dL — ABNORMAL HIGH (ref 0.50–1.10)
GFR calc Af Amer: 7 mL/min — ABNORMAL LOW (ref >90)
GFR calc Af Amer: 8 mL/min — ABNORMAL LOW (ref >90)
GFR, EST NON AFRICAN AMERICAN: 6 mL/min — AB (ref >90)
GFR, EST NON AFRICAN AMERICAN: 7 mL/min — AB (ref >90)
GLUCOSE: 98 mg/dL (ref 70–99)
Glucose, Bld: 124 mg/dL — ABNORMAL HIGH (ref 70–99)
Potassium: 4.7 mEq/L (ref 3.7–5.3)
Potassium: 4.8 mEq/L (ref 3.7–5.3)
Sodium: 160 mEq/L — ABNORMAL HIGH (ref 137–147)
Sodium: 153 mEq/L — ABNORMAL HIGH (ref 137–147)

## 2014-08-10 LAB — CBC
HEMATOCRIT: 21.9 % — AB (ref 36.0–46.0)
HEMOGLOBIN: 7 g/dL — AB (ref 12.0–15.0)
MCH: 28.3 pg (ref 26.0–34.0)
MCHC: 32 g/dL (ref 30.0–36.0)
MCV: 88.7 fL (ref 78.0–100.0)
Platelets: 164 10*3/uL (ref 150–400)
RBC: 2.47 MIL/uL — ABNORMAL LOW (ref 3.87–5.11)
RDW: 19.7 % — ABNORMAL HIGH (ref 11.5–15.5)
WBC: 6.7 10*3/uL (ref 4.0–10.5)

## 2014-08-10 LAB — GLUCOSE, CAPILLARY
GLUCOSE-CAPILLARY: 107 mg/dL — AB (ref 70–99)
GLUCOSE-CAPILLARY: 120 mg/dL — AB (ref 70–99)
Glucose-Capillary: 107 mg/dL — ABNORMAL HIGH (ref 70–99)
Glucose-Capillary: 111 mg/dL — ABNORMAL HIGH (ref 70–99)
Glucose-Capillary: 124 mg/dL — ABNORMAL HIGH (ref 70–99)
Glucose-Capillary: 82 mg/dL (ref 70–99)
Glucose-Capillary: 83 mg/dL (ref 70–99)
Glucose-Capillary: 84 mg/dL (ref 70–99)
Glucose-Capillary: 97 mg/dL (ref 70–99)
Glucose-Capillary: 99 mg/dL (ref 70–99)

## 2014-08-10 LAB — URINE CULTURE: Colony Count: >=100000

## 2014-08-10 LAB — PHOSPHORUS: Phosphorus: 5.1 mg/dL — ABNORMAL HIGH (ref 2.3–4.6)

## 2014-08-10 LAB — MAGNESIUM: Magnesium: 1.9 mg/dL (ref 1.5–2.5)

## 2014-08-10 LAB — PROCALCITONIN: Procalcitonin: 0.29 ng/mL

## 2014-08-10 LAB — CORTISOL: CORTISOL PLASMA: 20.9 ug/dL

## 2014-08-10 LAB — VANCOMYCIN, RANDOM: Vancomycin Rm: 12 ug/mL

## 2014-08-10 MED ORDER — DEXTROSE 5 % IV SOLN
INTRAVENOUS | Status: DC
  Administered 2014-08-10 – 2014-08-11 (×4): via INTRAVENOUS

## 2014-08-10 MED ORDER — CHLORHEXIDINE GLUCONATE CLOTH 2 % EX PADS
6.0000 | MEDICATED_PAD | Freq: Every day | CUTANEOUS | Status: AC
  Administered 2014-08-10 – 2014-08-14 (×4): 6 via TOPICAL

## 2014-08-10 MED ORDER — INSULIN ASPART 100 UNIT/ML ~~LOC~~ SOLN
0.0000 [IU] | SUBCUTANEOUS | Status: DC
  Administered 2014-08-10: 1 [IU] via SUBCUTANEOUS
  Administered 2014-08-11: 2 [IU] via SUBCUTANEOUS
  Administered 2014-08-11 (×2): 1 [IU] via SUBCUTANEOUS
  Administered 2014-08-11: 3 [IU] via SUBCUTANEOUS
  Administered 2014-08-12 (×2): 2 [IU] via SUBCUTANEOUS
  Administered 2014-08-12: 1 [IU] via SUBCUTANEOUS
  Administered 2014-08-12 (×2): 2 [IU] via SUBCUTANEOUS
  Administered 2014-08-12 – 2014-08-13 (×4): 1 [IU] via SUBCUTANEOUS
  Administered 2014-08-13: 2 [IU] via SUBCUTANEOUS
  Administered 2014-08-13: 1 [IU] via SUBCUTANEOUS
  Administered 2014-08-14: 2 [IU] via SUBCUTANEOUS
  Administered 2014-08-14: 1 [IU] via SUBCUTANEOUS
  Administered 2014-08-14 (×2): 2 [IU] via SUBCUTANEOUS
  Administered 2014-08-15: 1 [IU] via SUBCUTANEOUS
  Administered 2014-08-15: 2 [IU] via SUBCUTANEOUS

## 2014-08-10 MED ORDER — ATROPINE SULFATE 0.1 MG/ML IJ SOLN
INTRAMUSCULAR | Status: AC
  Filled 2014-08-10: qty 10

## 2014-08-10 MED ORDER — PANTOPRAZOLE SODIUM 40 MG PO PACK
40.0000 mg | PACK | ORAL | Status: DC
  Administered 2014-08-10 – 2014-08-13 (×4): 40 mg
  Filled 2014-08-10 (×5): qty 20

## 2014-08-10 MED ORDER — VANCOMYCIN HCL IN DEXTROSE 1-5 GM/200ML-% IV SOLN
1000.0000 mg | INTRAVENOUS | Status: DC
  Administered 2014-08-10: 1000 mg via INTRAVENOUS
  Filled 2014-08-10: qty 200

## 2014-08-10 MED ORDER — MUPIROCIN 2 % EX OINT
1.0000 "application " | TOPICAL_OINTMENT | Freq: Two times a day (BID) | CUTANEOUS | Status: AC
  Administered 2014-08-10 – 2014-08-14 (×10): 1 via NASAL
  Filled 2014-08-10 (×2): qty 22

## 2014-08-10 MED ORDER — FREE WATER
200.0000 mL | Freq: Four times a day (QID) | Status: DC
  Administered 2014-08-10 – 2014-08-14 (×17): 200 mL

## 2014-08-10 NOTE — Progress Notes (Signed)
ANTIBIOTIC CONSULT NOTE - INITIAL  Pharmacy Consult for Vancomycin Indication: rule out sepsis  Allergies  Allergen Reactions  . Penicillins Anaphylaxis  . Vancomycin Rash   Labs: Vancomycin Random: 12 mg/L   Recent Labs  08/08/14 2120 08/10/14 0430  WBC 4.5 6.7  HGB 7.3* 7.0*  PLT 164 164  CREATININE 6.05*  --    Estimated Creatinine Clearance: 8.2 ml/min (by C-G formula based on Cr of 6.05).  Assessment: Vancomycin random level of 12 this AM   Goal of Therapy:  Vancomycin trough level 15-20 mcg/ml  Plan:  -Start vancomycin 1000 mg IV q48h -May check another random before second dose with any changes in renal function   Abran DukeLedford, Colleena Kurtenbach 08/10/2014,5:32 AM

## 2014-08-10 NOTE — ED Provider Notes (Addendum)
I saw and evaluated the patient, reviewed the resident's note and I agree with the findings and plan.   EKG Interpretation   Date/Time:  Tuesday August 08 2014 21:19:31 EDT Ventricular Rate:  40 PR Interval:    QRS Duration: 94 QT Interval:  496 QTC Calculation: 404 R Axis:   59 Text Interpretation:  Junctional rhythm Abnormal T, consider ischemia,  lateral leads junctional rhythm new since previous  Confirmed by Raji Glinski  MD,  Destyn Parfitt ([id number]) on 08/08/2014 9:25:27 PM      [name] is a 68 y.o. female hx of HTN, dementia, DM here with AMS. Her family heard her fall in the bathroom. EMS noted CBG of 27 and only responsive to painful stimuli. Also bradycardic en route and required external pacing. Patient unable to give history and is not protecting airway and is lethargic on arrival. Patient intubated by the resident under my supervision. She is on a pacer. When we tried to turn it off, she doesn't have any underlying rhythm. She continues to be on external pacer. Patient is on a c collar on arrival and has R eyebrow laceration around 1 inch. Abdomen soft nontender. She is also hypothermic. After intubation, started becoming hypotensive. Patient was put on levophed and resident placed femoral line. Patient's CT showed possible cervical infection. UA + UTI. Sepsis workup initiated. Started on IV abx. Will admit to critical care for septic shock, bradycardia required pacing, renal failure, hypernatremia, bradycardia, and hypothermia.   Level V caveat- condition of patient    CRITICAL CARE Performed by: Silverio LayYAO, Kacelyn Rowzee   Total critical care time: 30 min   Critical care time was exclusive of separately billable procedures and treating other patients.  Critical care was necessary to treat or prevent imminent or life-threatening deterioration.  Critical care was time spent personally by me on the following activities: development of treatment plan with patient and/or surrogate as well as  nursing, discussions with consultants, evaluation of patient's response to treatment, examination of patient, obtaining history from patient or surrogate, ordering and performing treatments and interventions, ordering and review of laboratory studies, ordering and review of radiographic studies, pulse oximetry and re-evaluation of patient's condition.   Richardean Canalavid H Chayse Zatarain, MD 08/10/14 1503  Richardean Canalavid H Lasasha Brophy, MD 08/10/14 [phone]

## 2014-08-10 NOTE — Progress Notes (Addendum)
Thank you for consulting the Palliative Medicine Team at [facility] to meet your patient's and family's needs.   The reason that you asked us to see your patient is  For Clarification of GOC and options  We have scheduled your patient for a meeting: 08-11-14 at 1200 noon (this is the first available time family can meet)  The Surrogate decision make is:  Erika Kitchensatashia  [name] (daughter)  Your patient is unable to participate  Erika CreedMary Larach NP  Palliative Medicine Team Team Phone # [phone] Pager [phone]

## 2014-08-10 NOTE — Progress Notes (Signed)
Patient ID: [name], female   DOB: Mar 13, 1946, 68 y.o.   MRN: [id number]   eLink Physician Progress Note and Electrolyte Replacement  Patient Name: [name] DOB: Mar 13, 1946 MRN: [id number]  Date of Service  08/10/2014   HPI/Events of Note    Recent Labs Lab 08/08/14 2120 08/10/14 0430  NA 155* 160*  K 5.1 4.7  CL 128* >130*  CO2 13* 14*  GLUCOSE 144* 98  BUN 123* 105*  CREATININE 6.05* 6.11*  CALCIUM 8.1* 8.4  MG  --  1.9  PHOS  --  5.1*    Estimated Creatinine Clearance: 8 ml/min (by C-G formula based on Cr of 6.11).  Intake/Output     08/19 0701 - 08/20 0700   I.V. (mL/kg) 2200 (30.6)   NG/GT 250   IV Piggyback 200   Total Intake(mL/kg) 2650 (36.9)   Urine (mL/kg/hr) 1410 (0.8)   Total Output 1410   Net +1240        - I/O DETAILED x 24h    Total I/O In: 1270 [I.V.:1000; NG/GT:170; IV Piggyback:100] Out: 635 [Urine:635] - I/O THIS SHIFT    ASSESSMENT Renal failure Na 160 Cl > 130  All point to intravascular dehydration Patient on Normal saline  eICURN Interventions  Dc normal saline Start d5water Recheck bmet in 4h Might need to consider levophed ion d5w   ASSESSMENT: MAJOR ELECTROLYTE      Dr. Kalman ShanMurali Archer Vise, M.D., [facility].P Pulmonary and Critical Care Medicine Staff Physician [facility] [place] Pulmonary and Critical Care Pager: [phone], If no answer or between  15:00h - 7:00h: call [phone]  08/10/2014 6:01 AM

## 2014-08-10 NOTE — Progress Notes (Signed)
CRITICAL VALUE ALERT  Critical value received: Chloride > 130  Date of notification: 08/10/14  Time of notification: 0550  Critical value read back: yes  Nurse who received alert: Lucille PassyJ. Parrish, RN reported to J. Kaylyn LimSutter, RN  MD notified (1st page): Dr. Marchelle Gearingamaswamy  Time of first page: 0550  Responding MD: CCM RN for Dr. Marchelle Gearingamaswamy  Time MD responded: [phone]

## 2014-08-10 NOTE — Consult Note (Signed)
Patient [name]      DOB: 09/05/1946      NOI:[id number]  Summary of Goals of care; full note to follow:  Met with daughter [name] who is primary care giver and official decision maker per [name] and her Aunt.  Son not able to be at meeting but offered to communicate with him later.  [name] would also like her [name] to have updates and her [name] and her spouse [name].  [name] has been told by her mother that she would not want to suffer or be in pain.  [name] did not have a living will, nor a POA. Daughter clearly supported as Air traffic controller.  [name] would like to continue to treat infection and respiratory failure as well as other chronic illnesses but does accept that if her mother were to code while on ventilator that she would not want her to have CPR or defibrilation. She also dose not support using the external pacemaker again.  [name] is in favor of using medications to treat bradyarrhythmia and blood pressure issues while she gives her mom time to rally.  She is open to continued discussions regarding the use of her vent but feels it is too early to consider discontinuation at this time.   Total time:  212-300 pm   Discussed with Dr. Titus Mould, Dr Nelda Marseille and CCM NP  [name], MD MBA The Palliative Medicine Team at [facility] Phone: [phone] Pager: [phone] ( Use team phone after hours)

## 2014-08-10 NOTE — Progress Notes (Signed)
Pt arrived to 2mw at this time.

## 2014-08-10 NOTE — Consult Note (Addendum)
Patient XB:JYNWGN A Reister      DOB: April 09, 1946      FAO:[id number]     Consult Note from the Palliative Medicine Team at [place] Requested by: Dr. Titus Mould    PCP: Birdie Riddle, MD Reason for Consultation: urgent [place]    Phone Number:[phone]  Assessment of patients Current state: Patient is a 68 yr old african [place] female with past medical history for advanced dementia and diabetes.  She lives with her daughter Ronny Bacon and her son in Sports coach.  Ronny Bacon reports that she was in her usual state of health- reasonable but not robust appetite, up and around.  As of Friday, Ronny Bacon noticed that her eating was dropping off, but her mother did not indicate any pain or other problems.  Ronny Bacon put her mother on the toilet and went downstairs, she states they heard a thump and ran to see what happened. They found that she had fallen off the toilet and was not rousable.  Patient was found to be hypoglycemic , and bradycardic, and hypothermic.  Patient was found to be septic with UTI course further complicated by bradycardia requiring pacing, hypothermia, hypothyroidism, and respiratory failure.  She was intubated. I met with Ronny Bacon and the patient's available siblings- [place] and her spouse who have a good grasp of the situation and supported Ronny Bacon in her choices and decisions. They also affirmed that Ronny Bacon was requested by her mother to be her surrogate decision maker. We attempted to have the patient's on present but he could not get off work. Ronny Bacon decided that while she felt it was too early to talk about what to do with the ventilator she did agree that she did not want to cause her mother to suffer by allowing CPR, Transcutaneous pacing , defibrillation or other ACLS support.  She did want to try to control basic arrythmia's while we were giving her the opportunity to try to recover.  We , therefore , agreed to limit her code status to the use of medication for arrythmia  and blood pressure, along with continuing the vent support, but NOT doing CPR defibrillation, or transcutaneous pacing.  Ronny Bacon gave me permission to update her brother Minna Merritts and her Shearon Balo if requested.    Goals of Care: 1.  Code Status: Limited Code- no CPR, No transcutaneous pacing, no defibrillation. Continue vent support medications to treat bradycardia or hypotension.   2. Scope of Treatment: Treat the treatable and reassess vent status.  Family is familiar with potential for discontinuing vent support as Ronny Bacon started the conversation by saying "I think it is too soon to take the vent away."   4. Disposition: to be determined   3. Symptom Management:   1. UTI /sepsis: continue to aggressively treat 2. Bradycardia- use atropine or dopamine if needed and reengage family if perfusion compromised or protracted support would cause more harm than good. 3. Hypothyroidism- being corrected with IV synthroid. 4. Agitation: prn meds in place 5. Anemia- no clear cause , will need to address desire for blood transfusion in next goals of care. 6. Please note CT scan findings which may need further investigation when patient stable.   4. Psychosocial: Ronny Bacon describes her mom as her best friend, a woman who would give at her own expense.  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 Spiritual: Darrick Meigs faith is important to her.        Patient Documents Completed or Given:  Document Given Completed  Advanced Directives Pkt    MOST    DNR    Gone from My Sight    Hard Choices      Brief HPI: 68 yr old african [place] female with advanced dementia, hypothyroidism , hypertension and diabetes.  Patient had a syncopal event at home found to be septic.  We were asked to do emergent goals of care due to reoccurrence of bradycardia.   ROS: Can not be obtained due to intubated patient     PMH:  Past Medical History  Diagnosis Date  . Hypertension    . DEMENTIA   . Diabetes mellitus   . Arthritis      PSH: Past Surgical History  Procedure Laterality Date  . Toe amputation      2 toes  . Ankle broken    . Clavicle surgery    . Abdominal hysterectomy    . Fracture surgery      ankle  . Cholecystectomy    . I&d extremity  05/14/2012    Procedure: IRRIGATION AND DEBRIDEMENT EXTREMITY;  Surgeon: Schuyler Amor, MD;  Location: [place];  Service: Orthopedics;  Laterality: Left;  . Orif finger fracture  05/14/2012    Procedure: OPEN REDUCTION INTERNAL FIXATION (ORIF) METACARPAL (FINGER) FRACTURE;  Surgeon: Schuyler Amor, MD;  Location: [place];  Service: Orthopedics;  Laterality: Left;   I have reviewed the [place] and SH and  If appropriate update it with new information. Allergies  Allergen Reactions  . Penicillins Anaphylaxis  . Vancomycin Rash   Scheduled Meds: . antiseptic oral rinse  7 mL Mouth Rinse QID  . atropine      . aztreonam  500 mg Intravenous 3 times per day  . chlorhexidine  15 mL Mouth Rinse BID  . Chlorhexidine Gluconate Cloth  6 each Topical Q0600  . feeding supplement (VITAL HIGH PROTEIN)  1,000 mL Per Tube Q24H  . free water  200 mL Per Tube Q6H  . insulin aspart  0-9 Units Subcutaneous 6 times per day  . levothyroxine  25 mcg Intravenous Daily  . mupirocin ointment  1 application Nasal BID  . pantoprazole sodium  40 mg Per Tube Q24H  . vancomycin  1,000 mg Intravenous Q48H   Continuous Infusions: . dextrose 100 mL/hr at 08/10/14 0800   PRN Meds:.fentaNYL, fentaNYL, ipratropium-albuterol, midazolam    BP 145/54  Pulse 94  Temp(Src) 96.6 F (35.9 C) (Oral)  Resp 14  Ht 5' 1.81" (1.57 m)  Wt 71.8 kg (158 lb 4.6 oz)  BMI 29.13 kg/m2  SpO2 100%   PPS: prior to admission sound like 30-40%   Intake/Output Summary (Last 24 hours) at 08/10/14 1535 Last data filed at 08/10/14 1500  Gross per 24 hour  Intake   3400 ml  Output   1485 ml  Net   1915 ml    Physical Exam:  General: slightly  agitated trying to kick legs over bedside, eyes closed not following commands or focusing HEENT:  Pupils not examined, ETT, and NG tube in place Chest:   Decreased , with some crackles CVS: regular , s1, s2 Abdomen:soft not distended Ext: cool, toe amputation, trace pedal edema, hyperpigmented Neuro: agitated and obtunded  Labs: CBC    Component Value Date/Time   WBC 6.7 08/10/2014 0430   RBC 2.47* 08/10/2014 0430   HGB 7.0* 08/10/2014 0430   HCT 21.9* 08/10/2014 0430   PLT 164 08/10/2014 0430   MCV 88.7 08/10/2014 0430  MCH 28.3 08/10/2014 0430   MCHC 32.0 08/10/2014 0430   RDW 19.7* 08/10/2014 0430   LYMPHSABS 0.6* 08/08/2014 2120   MONOABS 0.1 08/08/2014 2120   EOSABS 0.0 08/08/2014 2120   BASOSABS 0.0 08/08/2014 2120     CMP     Component Value Date/Time   NA 160* 08/10/2014 0430   K 4.7 08/10/2014 0430   CL >130* 08/10/2014 0430   CO2 14* 08/10/2014 0430   GLUCOSE 98 08/10/2014 0430   BUN 105* 08/10/2014 0430   CREATININE 6.11* 08/10/2014 0430   CALCIUM 8.4 08/10/2014 0430   PROT 5.7* 08/08/2014 2120   ALBUMIN 2.4* 08/08/2014 2120   AST 159* 08/08/2014 2120   ALT 176* 08/08/2014 2120   ALKPHOS 249* 08/08/2014 2120   BILITOT <0.2* 08/08/2014 2120   GFRNONAA 6* 08/10/2014 0430   GFRAA 7* 08/10/2014 0430    Chest Xray Reviewed/Impressions:  1. Worsening pulmonary vascular congestion now with interstitial  pulmonary edema.  2. Slightly enlarged bilateral layering pleural effusions and  associated bibasilar atelectasis.  3. Stable and satisfactory support apparatus.    CT scan of the Head Reviewed/Impressions: 1. No acute intracranial findings. Age related cerebral atrophy,  ventriculomegaly and periventricular white matter disease are noted.  2. No acute skull fracture.  3. Normal alignment of the cervical vertebral bodies and no acute  fracture.  4. Widened C4-5 facet joint on the left could be due to facet  arthropathy. Could not exclude a septic joint. MRI may be helpful   for further evaluation (without and with contrast) when able.     Time In Time Out Total Time Spent with Patient Total Overall Time  212 pm 300 pm 15 min 48 min    Greater than 50%  of this time was spent counseling and coordinating care related to the above assessment and plan.  Bitha Fauteux L. Lovena Le, MD MBA The Palliative Medicine Team at [facility] Phone: [phone] Pager: [phone] ( Use team phone after hours)    Addendum: spent additional 20 minutes talking with patient's son in evening after initial consultation.  530 pm- 550 pm  Emberlee Sortino L. Lovena Le, MD MBA The Palliative Medicine Team at [facility] Phone: [phone] Pager: [phone] ( Use team phone after hours)

## 2014-08-10 NOTE — Progress Notes (Addendum)
PULMONARY / CRITICAL CARE MEDICINE   Name: [name] MRN: [id number] DOB: [birth year]    ADMISSION DATE:  08/08/2014  REFERRING MD :  EDP  CHIEF COMPLAINT:  AMS  INITIAL PRESENTATION:  68 yo female found unresponsive at home.  In ED noted to have hypotension, bradycardia, hypothermia likely in setting of UTI and hypothyroidism.  She was intubated for airway protection, and PCCM asked to admit.  She has hx of advanced dementia.  STUDIES:  8/18 CT head >> age related atrophy 8/18 CT C spine >> widened C4-C5 facet joints  SIGNIFICANT EVENTS: 8/18 Fall from toilet, brady on pacer pads, intubated in ED. Admitted to ICU 8/19 Off pressors  SUBJECTIVE:  Off pressors.  VITAL SIGNS: Temp:  [97.4 F (36.3 C)-98.6 F (37 C)] 98.2 F (36.8 C) (08/20 0400) Pulse Rate:  [64-96] 87 (08/20 0545) Resp:  [0-22] 10 (08/20 0545) BP: (109-145)/(43-70) 134/52 mmHg (08/20 0530) SpO2:  [98 %-100 %] 100 % (08/20 0545) FiO2 (%):  [40 %-50 %] 40 % (08/20 0600) Weight:  [158 lb 4.6 oz (71.8 kg)] 158 lb 4.6 oz (71.8 kg) (08/20 0500) VENTILATOR SETTINGS: Vent Mode:  [-] PRVC FiO2 (%):  [40 %-50 %] 40 % Set Rate:  [16 bmp] 16 bmp Vt Set:  [400 mL] 400 mL PEEP:  [5 cmH20] 5 cmH20 Plateau Pressure:  [12 cmH20-16 cmH20] 15 cmH20 INTAKE / OUTPUT:  Intake/Output Summary (Last 24 hours) at 08/10/14 0723 Last data filed at 08/10/14 0535  Gross per 24 hour  Intake   2650 ml  Output   1410 ml  Net   1240 ml    PHYSICAL EXAMINATION: General: ill appearing Neuro: somnolent, opens eyes with stimulation, not following commands, moves extremities HEENT: Lt scalp laceration healing, C collar in place Cardiovascular: regular, no murmur Lungs: no wheeze Abdomen:  Soft, nontender Musculoskeletal:  1+ non pitting edema, 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 Skin: no rashes  LABS:  CBC  Recent Labs Lab 08/08/14 2120 08/10/14 0430  WBC 4.5 6.7  HGB 7.3* 7.0*  HCT 22.6* 21.9*  PLT 164 164    BMET  Recent Labs Lab 08/08/14 2120 08/10/14 0430  NA 155* 160*  K 5.1 4.7  CL 128* >130*  CO2 13* 14*  BUN 123* 105*  CREATININE 6.05* 6.11*  GLUCOSE 144* 98   Electrolytes  Recent Labs Lab 08/08/14 2120 08/10/14 0430  CALCIUM 8.1* 8.4  MG  --  1.9  PHOS  --  5.1*   Sepsis Markers  Recent Labs Lab 08/08/14 2205 08/09/14 0025 08/09/14 0255 08/09/14 1240 08/10/14 0430  LATICACIDVEN 1.13  --  0.7 0.8  --   PROCALCITON  --  <0.10  --   --  0.29   ABG  Recent Labs Lab 08/08/14 2358 08/10/14 0353  PHART 7.348* 7.297*  PCO2ART 22.7* 28.8*  PO2ART 69.0* 55.0*   Liver Enzymes  Recent Labs Lab 08/08/14 2120  AST 159*  ALT 176*  ALKPHOS 249*  BILITOT <0.2*  ALBUMIN 2.4*   Cardiac Enzymes  Recent Labs Lab 08/09/14 0153 08/09/14 0300  TROPONINI <0.30 <0.30   Glucose  Recent Labs Lab 08/09/14 0254 08/09/14 0332 08/09/14 0732 08/09/14 1135 08/09/14 1625 08/09/14 2026  GLUCAP 90 83 61* 72 66* 84    Imaging Dg Chest Port 1 View  08/09/2014   CLINICAL DATA:  Intubation.  EXAM: PORTABLE CHEST - 1 VIEW  COMPARISON:  06/08/2014.  FINDINGS: Endotracheal tube and NG tube in good  anatomic position. Cardiomegaly, stable. Pulmonary vascularity is normal. Low lung volumes with bibasilar atelectasis and/or mild infiltrate. Small all pleural effusions cannot be excluded. No pneumothorax. No acute osseous abnormality. Old posttraumatic deformity right clavicle.  IMPRESSION: 1. Line tubes in good anatomic position. Interim placement of NG tube. Its tip is below left hemidiaphragm. 2. Stable cardiomegaly with normal pulmonary vascularity. 3. Mild bibasilar atelectasis and/or infiltrates. Small pleural effusions cannot be excluded . 4. Old posttraumatic deformity right clavicle.   Electronically Signed   By: Maisie Fushomas  Register   On: 08/09/2014 07:45     ASSESSMENT / PLAN:  PULMONARY ETT 8/18 >>   A: Acute respiratory failure 2nd to encephalopathy and  inability to protect airway. P:   Pressure support wean as tolerated F/u CXR  CARDIOVASCULAR Rt femoral CVL 8/18 >>   A:  Hypovolemic/septic shock >> improved. Bradycardia 2nd to hypothyroidism/hypothermia >> improved. Hx of HTN. P:  Monitor hemodynamics Hold outpt norvasc, lasix, lopressor  RENAL A:   Acute on CKD - baseline creatinine approximately 5 >> do not think she is appropriate candidate for HD given baseline dementia. Hypernatremia. Hyperchloremic metabolic acidosis. P:   Change IV fluid to D5W F/u BMET, ABG Monitor renal fx, urine outpt  GASTROINTESTINAL A:   Elevated transaminases likely related to shock. Nutrition. P:   F/u LFT's Tube feeds while on vent Protonix for SUP  HEMATOLOGIC A:   Anemia of chronic disease and critical illness. P:  F/u CBC SQ heparin for DVT prevention Transfuse for Hb < 7 or bleeding  INFECTIOUS A:   Sepsis with UTI.  Hx of PCN allergy. P:   Day 3 vancomycin, azactam started 8/18  Blood 8/18 >>   ENDOCRINE A:   Hx of DM type II with episodes of hypoglycemia. Hypothyroidism. P:   SSI Continue synthroid  25 mcg IV for now >> dose increased compared to home dose Check cortisol  NEUROLOGIC A:   Acute metabolic encephalopathy. Hx of advanced dementia. P:   RASS goal: 0 Hold outpt xanax, elavil Keep C collar in place for now  Goals of Care >> need to discuss further with family.  Might need assistance from palliative care team.  CC time 35 minutes.  Coralyn HellingVineet Ayasha Ellingsen, MD [facility] Pulmonary/Critical Care 08/10/2014, 7:27 AM Pager:  [phone] After 3pm call: [phone]  Spoke with pt's daughter Marcelle Smilingatasha over the phone.  Updated about pt's status.  Discussed goals of care >> pt states they have living will and bring to hospital this PM.  She was not certain about issues regarding DNR/cardiac resuscitation/long term vent support if needed.  She was open to further discuss with palliative care team regarding goals of  care >> consult placed.  Coralyn HellingVineet Leylani Duley, MD [facility] Pulmonary/Critical Care 08/10/2014, 8:18 AM Pager:  [phone] After 3pm call: [phone]

## 2014-08-11 LAB — GLUCOSE, CAPILLARY
GLUCOSE-CAPILLARY: 106 mg/dL — AB (ref 70–99)
GLUCOSE-CAPILLARY: 138 mg/dL — AB (ref 70–99)
GLUCOSE-CAPILLARY: 153 mg/dL — AB (ref 70–99)
Glucose-Capillary: 141 mg/dL — ABNORMAL HIGH (ref 70–99)
Glucose-Capillary: 145 mg/dL — ABNORMAL HIGH (ref 70–99)
Glucose-Capillary: 165 mg/dL — ABNORMAL HIGH (ref 70–99)
Glucose-Capillary: 87 mg/dL (ref 70–99)

## 2014-08-11 LAB — POCT I-STAT 3, ART BLOOD GAS (G3+)
Acid-base deficit: 10 mmol/L — ABNORMAL HIGH (ref 0.0–2.0)
BICARBONATE: 15.6 meq/L — AB (ref 20.0–24.0)
O2 SAT: 97 %
PO2 ART: 103 mmHg — AB (ref 80.0–100.0)
Patient temperature: 98.5
TCO2: 17 mmol/L (ref 0–100)
pCO2 arterial: 31.4 mmHg — ABNORMAL LOW (ref 35.0–45.0)
pH, Arterial: 7.305 — ABNORMAL LOW (ref 7.350–7.450)

## 2014-08-11 LAB — CBC
HCT: 19.3 % — ABNORMAL LOW (ref 36.0–46.0)
HEMATOCRIT: 23.8 % — AB (ref 36.0–46.0)
Hemoglobin: 6.2 g/dL — CL (ref 12.0–15.0)
Hemoglobin: 7.5 g/dL — ABNORMAL LOW (ref 12.0–15.0)
MCH: 28.3 pg (ref 26.0–34.0)
MCH: 28 pg (ref 26.0–34.0)
MCHC: 32.1 g/dL (ref 30.0–36.0)
MCHC: 31.5 g/dL (ref 30.0–36.0)
MCV: 88.1 fL (ref 78.0–100.0)
MCV: 88.8 fL (ref 78.0–100.0)
PLATELETS: 155 10*3/uL (ref 150–400)
Platelets: 148 10*3/uL — ABNORMAL LOW (ref 150–400)
RBC: 2.19 MIL/uL — AB (ref 3.87–5.11)
RBC: 2.68 MIL/uL — AB (ref 3.87–5.11)
RDW: 19.1 % — ABNORMAL HIGH (ref 11.5–15.5)
RDW: 18.9 % — AB (ref 11.5–15.5)
WBC: 7.6 10*3/uL (ref 4.0–10.5)
WBC: 8.4 10*3/uL (ref 4.0–10.5)

## 2014-08-11 LAB — BASIC METABOLIC PANEL
Anion gap: 11 (ref 5–15)
BUN: 106 mg/dL — AB (ref 6–23)
CALCIUM: 7.9 mg/dL — AB (ref 8.4–10.5)
CO2: 15 mEq/L — ABNORMAL LOW (ref 19–32)
Chloride: 125 mEq/L — ABNORMAL HIGH (ref 96–112)
Creatinine, Ser: 5.92 mg/dL — ABNORMAL HIGH (ref 0.50–1.10)
GFR, EST AFRICAN AMERICAN: 8 mL/min — AB (ref >90)
GFR, EST NON AFRICAN AMERICAN: 7 mL/min — AB (ref >90)
Glucose, Bld: 88 mg/dL (ref 70–99)
POTASSIUM: 4.7 meq/L (ref 3.7–5.3)
SODIUM: 151 meq/L — AB (ref 137–147)

## 2014-08-11 LAB — HEPATIC FUNCTION PANEL
ALT: 210 U/L — AB (ref 0–35)
AST: 102 U/L — ABNORMAL HIGH (ref 0–37)
Albumin: 1.9 g/dL — ABNORMAL LOW (ref 3.5–5.2)
Alkaline Phosphatase: 283 U/L — ABNORMAL HIGH (ref 39–117)
TOTAL PROTEIN: 5.1 g/dL — AB (ref 6.0–8.3)
Total Bilirubin: <0.2 mg/dL — ABNORMAL LOW (ref 0.3–1.2)

## 2014-08-11 LAB — PREPARE RBC (CROSSMATCH)

## 2014-08-11 MED ORDER — SODIUM CHLORIDE 0.9 % IV SOLN
Freq: Once | INTRAVENOUS | Status: AC
  Administered 2014-08-11: 07:00:00 via INTRAVENOUS

## 2014-08-11 MED ORDER — WHITE PETROLATUM GEL
Status: AC
  Filled 2014-08-11: qty 5

## 2014-08-11 MED ORDER — HEPARIN SODIUM (PORCINE) 5000 UNIT/ML IJ SOLN
5000.0000 [IU] | Freq: Three times a day (TID) | INTRAMUSCULAR | Status: DC
  Administered 2014-08-11 – 2014-08-14 (×10): 5000 [IU] via SUBCUTANEOUS
  Filled 2014-08-11 (×12): qty 1

## 2014-08-11 MED ORDER — METRONIDAZOLE 500 MG PO TABS
500.0000 mg | ORAL_TABLET | Freq: Two times a day (BID) | ORAL | Status: DC
  Administered 2014-08-11 – 2014-08-13 (×6): 500 mg via ORAL
  Filled 2014-08-11 (×8): qty 1

## 2014-08-11 MED ORDER — SODIUM BICARBONATE 8.4 % IV SOLN
INTRAVENOUS | Status: DC
  Administered 2014-08-11: 11:00:00 via INTRAVENOUS
  Filled 2014-08-11 (×3): qty 150

## 2014-08-11 NOTE — Care Management Note (Addendum)
    Page 1 of 2   08/20/2014     9:41:09 AM CARE MANAGEMENT NOTE 08/20/2014  Patient:  [name],[name]   Account Number:  [id number]  Date Initiated:  08/10/2014  Documentation initiated by:  Junius CreamerWELL,DEBBIE  Subjective/Objective Assessment:   adm w unresponsiveness     Action/Plan:   lives w da, pcp dr Algie Cofferkadakia   Anticipated DC Date:     Anticipated DC Plan:    In-house referral  Hospice / Palliative Care      DC Planning Services  CM consult      Choice offered to / List presented to:             Status of service:   Medicare Important Message given?  YES (If response is "NO", the following Medicare IM given date fields will be blank) Date Medicare IM given:  08/18/2014 Medicare IM given by:  Ronny FlurryWILE,HEATHER Date Additional Medicare IM given:  08/14/2014 Additional Medicare IM given by:  Junius CreamerEBBIE DOWELL  Discharge Disposition:    Per UR Regulation:  Reviewed for med. necessity/level of care/duration of stay  If discussed at Long Length of Stay Meetings, dates discussed:   08/15/2014  08/17/2014    Comments:  08/20/14 08:00 Per Previous CM request, DC summary faxed to [facility].  No other CM needs were communicated.  Freddy Jakschsarah Kumiko Fishman, BSN, CM [phone].  Pt to d/c to daughter's home: [address] [place] [id number]  daughter [name] c: [phone]   08-18-14 Gold DNR form in shadow chart needs MD signature ( paged MD ) .  DC summary needs to be [facility] [phone].  SW aware transportation needed .  Ronny FlurryHeather Wile RN BSN [phone]   08-18-14 Dr Ladona Ridgelaylor called plan is home with hospice . Called daughter Marcelle Smilingatasha [phone] , she confirmed plan. Jeneen RinksGave [name] list of home hospice agencies over phone. She would like [facility] ( referral given to same) .  Marcelle SmilingNatasha confirmed PCP is DR Algie CofferKadakia , patinet does NOT have oxygen at home. Marcelle Smilingatasha would like hospital bed and her mother transfered by ambulance at  time of discharge .  Ronny FlurryHeather Wile RN BSN [phone]

## 2014-08-11 NOTE — Progress Notes (Signed)
eLink Physician-Brief Progress Note Patient Name: [name] DOB: 11/25/1946 MRN: [id number]   Date of Service  08/11/2014  HPI/Events of Note   Recent Labs Lab 08/08/14 2120 08/10/14 0430 08/11/14 0430  HGB 7.3* 7.0* 6.2*   No obvious bleed  eICU Interventions  1 unit prbc for anemia of critical illness     Intervention Category Intermediate Interventions: Diagnostic test evaluation  Harshika Mago 08/11/2014, 4:52 AM

## 2014-08-11 NOTE — Progress Notes (Signed)
   This NP placed call to patient's daughter [name] to offer f/u support from PMT.  She expressed appreciation for Dr Lubertha Basqueaylor's meeting on 08-10-14.  No questions or concerns at this time.  At this time family remains hopeful for improvement.  Watchful waiting through the week-end. Encouraged to continue conversation within family regarding advanced directive decisions and anticipatory care needs in order to enhance patient centered care.  [name] is encouraged to call with questions or concerns   PMT will f/u on Monday.  Lorinda CreedMary Larach NP  Palliative Medicine Team Team Phone # [phone] Pager [phone]

## 2014-08-11 NOTE — Progress Notes (Signed)
PULMONARY / CRITICAL CARE MEDICINE   Name: [name] MRN: [id number] DOB: [birth year]    ADMISSION DATE:  08/08/2014  REFERRING MD :  EDP  CHIEF COMPLAINT:  AMS  INITIAL PRESENTATION:  68 yo female found unresponsive at home.  In ED noted to have hypotension, bradycardia, hypothermia likely in setting of UTI and hypothyroidism.  She was intubated for airway protection, and PCCM asked to admit.  She has hx of advanced dementia.  STUDIES:  8/18 CT head >> age related atrophy 8/18 CT C spine >> widened C4-C5 facet joints  SIGNIFICANT EVENTS: 8/18 Fall from toilet, brady on pacer pads, intubated in ED. Admitted to ICU 8/19 Off pressors 8/20 brady improved, DNR established  SUBJECTIVE:  Not brady  VITAL SIGNS: Temp:  [96.6 F (35.9 C)-100.2 F (37.9 C)] 98.6 F (37 C) (08/21 0900) Pulse Rate:  [71-94] 75 (08/21 0907) Resp:  [0-20] 13 (08/21 0907) BP: (125-159)/(50-67) 156/60 mmHg (08/21 0907) SpO2:  [98 %-100 %] 100 % (08/21 0907) FiO2 (%):  [40 %] 40 % (08/21 0907) Weight:  [72.9 kg (160 lb 11.5 oz)] 72.9 kg (160 lb 11.5 oz) (08/21 0500) VENTILATOR SETTINGS: Vent Mode:  [-] PSV;CPAP FiO2 (%):  [40 %] 40 % Set Rate:  [12 bmp] 12 bmp Vt Set:  [400 mL] 400 mL PEEP:  [5 cmH20] 5 cmH20 Pressure Support:  [5 cmH20] 5 cmH20 Plateau Pressure:  [10 cmH20-16 cmH20] 10 cmH20 INTAKE / OUTPUT:  Intake/Output Summary (Last 24 hours) at 08/11/14 0944 Last data filed at 08/11/14 0900  Gross per 24 hour  Intake 4928.75 ml  Output   1750 ml  Net 3178.75 ml    PHYSICAL EXAMINATION: General: ill appearing Neuro: not opening eyes, sits up , purposeful movement HEENT: Lt scalp laceration healing, C collar in place Cardiovascular: s1 s2 regular, no murmur Lungs: coarse Abdomen:  Soft, nontender Musculoskeletal:  1+ non pitting edema, 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 Skin: no rashes  LABS:  CBC  Recent Labs Lab 08/08/14 2120 08/10/14 0430 08/11/14 0430  WBC 4.5  6.7 7.6  HGB 7.3* 7.0* 6.2*  HCT 22.6* 21.9* 19.3*  PLT 164 164 155   BMET  Recent Labs Lab 08/10/14 0430 08/10/14 1500 08/11/14 0430  NA 160* 153* 151*  K 4.7 4.8 4.7  CL >130* 127* 125*  CO2 14* 13* 15*  BUN 105* 106* 106*  CREATININE 6.11* 5.90* 5.92*  GLUCOSE 98 124* 88   Electrolytes  Recent Labs Lab 08/10/14 0430 08/10/14 1500 08/11/14 0430  CALCIUM 8.4 8.2* 7.9*  MG 1.9  --   --   PHOS 5.1*  --   --    Sepsis Markers  Recent Labs Lab 08/08/14 2205 08/09/14 0025 08/09/14 0255 08/09/14 1240 08/10/14 0430  LATICACIDVEN 1.13  --  0.7 0.8  --   PROCALCITON  --  <0.10  --   --  0.29   ABG  Recent Labs Lab 08/08/14 2358 08/10/14 0353 08/10/14 1520  PHART 7.348* 7.297* 7.257*  PCO2ART 22.7* 28.8* 32.9*  PO2ART 69.0* 55.0* 146.0*   Liver Enzymes  Recent Labs Lab 08/08/14 2120 08/11/14 0430  AST 159* 102*  ALT 176* 210*  ALKPHOS 249* 283*  BILITOT <0.2* <0.2*  ALBUMIN 2.4* 1.9*   Cardiac Enzymes  Recent Labs Lab 08/09/14 0153 08/09/14 0300  TROPONINI <0.30 <0.30   Glucose  Recent Labs Lab 08/10/14 1242 08/10/14 1527 08/10/14 2009 08/11/14 0033 08/11/14 0336 08/11/14 0842  GLUCAP 107* 82  120* 153* 87 145*    Imaging Dg Chest Port 1 View  08/10/2014   CLINICAL DATA:  Intubated, evaluate endotracheal tube  EXAM: PORTABLE CHEST - 1 VIEW  COMPARISON:  Prior chest x-ray 08/09/2014  FINDINGS: Patient is rotated toward the right. Given rotated position, the degree of cardiomegaly is similar compared to prior. However, there has been an interval increase in pulmonary vascular congestion now with mild interstitial edema. Slightly enlarged bilateral layering pleural effusions. Persistent bibasilar opacities favored to reflect atelectasis. No pneumothorax. The tip of the orogastric tube lies below the diaphragm presumably within the stomach. The endotracheal tube is 4.7 cm above the carina.  IMPRESSION: 1. Worsening pulmonary vascular  congestion now with interstitial pulmonary edema. 2. Slightly enlarged bilateral layering pleural effusions and associated bibasilar atelectasis. 3. Stable and satisfactory support apparatus.   Electronically Signed   By: Malachy MoanHeath  McCullough M.D.   On: 08/10/2014 07:45     ASSESSMENT / PLAN:  PULMONARY ETT 8/18 >>   A: Acute respiratory failure 2nd to encephalopathy and inability to protect airway. Intestinal edema 8/21 P:   Weaning cpap 5 ps 5, goal 1 hr When we consider extubation, will have discussion on reintubation status consider lasix pcxr in am for volume status Last abg reviewed, as weaning well this am , no repeat, ensure rate 20 on rest bicarb  CARDIOVASCULAR Rt femoral CVL 8/18 >>   A:  Hypovolemic/septic shock >> improved. Bradycardia 2nd to hypothyroidism/hypothermia >> improved. Hx of HTN. P:  Monitor hemodynamics Hold outpt norvasc, lasix, lopressor Pressors would be medically ineffective May need addition HTN meds, hydral with prior brady Keep temp wnl  RENAL A:   Acute on CKD - baseline creatinine approximately 5 >> do not think she is appropriate candidate for HD given baseline dementia. Hypernatremia. Hyperchloremic metabolic acidosis, NONAG P:   F/u BMETin am  Monitor renal fx, urine outpt Continued pos balance, d5w Consider addition bicarb for NON AG   GASTROINTESTINAL A:   Elevated transaminases likely related to shock. Nutrition. P:   Tube feeds while on vent Protonix for SUP  HEMATOLOGIC A:   Anemia of chronic disease and critical illness. P:  F/u CBC in 4 hours SQ heparin for DVT prevention Tx 1 unit done  INFECTIOUS A:   Sepsis with UTI.  Hx of PCN allergy. RN concerned BV P:   Vancomycin 8/18>>>8/21 azactam started 8/18>>>add stop date 8/24 Urine 8/19>>>lactobic (unlikley pathogen) Blood 8/18 >>  Treat empiric BV, flagyl  ENDOCRINE A:   Hx of DM type II with episodes of hypoglycemia. Hypothyroidism. Cortisol  20.9 P:   SSI Continue synthroid  25 mcg IV , assess tsh 9.75, keep higher than home dose Check cortisol, no role steroids  NEUROLOGIC A:   Acute metabolic encephalopathy. Hx of advanced dementia. P:   RASS goal: 0 Hold outpt xanax, elavil Keep C collar in place for now Avoid benzo  Goals of Care >>DNR, meds atropine, ok per family  Global: DNR noted, HD, any forms of life support, reintubation would be medically ineffective therapy and futile, will discuss reintubation status with family, weaning, rate to 20 CC time 30 minutes.   Mcarthur Rossettianiel J. Tyson AliasFeinstein, MD, FACP Pgr: [phone] [facility]

## 2014-08-11 NOTE — Progress Notes (Signed)
ANTIBIOTIC CONSULT NOTE - F/u  Pharmacy Consult for Aztreonam  Indication: sepsis/UTI  Allergies  Allergen Reactions  . Penicillins Anaphylaxis  . Vancomycin Rash    Patient Measurements: Height: 5' 1.81" (157 cm) Weight: 160 lb 11.5 oz (72.9 kg) IBW/kg (Calculated) : 49.67  Vital Signs: Temp: 98.6 F (37 C) (08/21 0900) Temp src: Rectal (08/21 0900) BP: 156/60 mmHg (08/21 0907) Pulse Rate: 75 (08/21 0907)  Labs:  Recent Labs  08/08/14 2120 08/10/14 0430 08/10/14 1500 08/11/14 0430  WBC 4.5 6.7  --  7.6  HGB 7.3* 7.0*  --  6.2*  PLT 164 164  --  155  CREATININE 6.05* 6.11* 5.90* 5.92*   Estimated Creatinine Clearance: 8.4 ml/min (by C-G formula based on Cr of 5.92).   Medical History: Past Medical History  Diagnosis Date  . Hypertension   . DEMENTIA   . Diabetes mellitus   . Arthritis   . Hypothyroidism     Assessment: 69 yof s/p fall continuing on Abx D#4 aztreonam for sepsis/UTI. Afebrile, wbc wnl, PCT trended up to 0.29 on 8/20 from <0.1. SCr elevated but trending down 6.11>>5.92 (BL~5 from 1 year ago). CrCl<10, UOP good.  8/18 Vanc>>8/21 8/18 Aztreonam>> (8/24) 8/21 Flagyl >> (8/28)  8/20 VT: 12  8/19 MRSA PCR + 8/18 Bld x2>> ngtd 8/18 Urine>> lactobacillus  Goal of Therapy:  Eradication of Infection  Plan:  -Aztreonam 500 mg IV q8h (stop 8/24) -D/c vanc -Flagyl  po BID for BV x7 days (stop 8/28) -F/u c/s, clinical progress, renal function  Babs Bertin, PharmD Clinical Pharmacist - Resident Pager [phone] 08/11/2014 12:30 PM

## 2014-08-12 LAB — CBC WITH DIFFERENTIAL/PLATELET
BASOS ABS: 0 10*3/uL (ref 0.0–0.1)
Basophils Relative: 0 % (ref 0–1)
Eosinophils Absolute: 0.1 10*3/uL (ref 0.0–0.7)
Eosinophils Relative: 2 % (ref 0–5)
HCT: 20.6 % — ABNORMAL LOW (ref 36.0–46.0)
Hemoglobin: 6.8 g/dL — CL (ref 12.0–15.0)
Lymphocytes Relative: 16 % (ref 12–46)
Lymphs Abs: 1.3 10*3/uL (ref 0.7–4.0)
MCH: 27.5 pg (ref 26.0–34.0)
MCHC: 33 g/dL (ref 30.0–36.0)
MCV: 83.4 fL (ref 78.0–100.0)
Monocytes Absolute: 0.7 10*3/uL (ref 0.1–1.0)
Monocytes Relative: 8 % (ref 3–12)
NEUTROS ABS: 6.2 10*3/uL (ref 1.7–7.7)
Neutrophils Relative %: 74 % (ref 43–77)
Platelets: 147 10*3/uL — ABNORMAL LOW (ref 150–400)
RBC: 2.47 MIL/uL — ABNORMAL LOW (ref 3.87–5.11)
RDW: 18.5 % — AB (ref 11.5–15.5)
WBC: 8.4 10*3/uL (ref 4.0–10.5)

## 2014-08-12 LAB — GLUCOSE, CAPILLARY
Glucose-Capillary: 157 mg/dL — ABNORMAL HIGH (ref 70–99)
Glucose-Capillary: 132 mg/dL — ABNORMAL HIGH (ref 70–99)
Glucose-Capillary: 171 mg/dL — ABNORMAL HIGH (ref 70–99)
Glucose-Capillary: 148 mg/dL — ABNORMAL HIGH (ref 70–99)
Glucose-Capillary: 158 mg/dL — ABNORMAL HIGH (ref 70–99)

## 2014-08-12 LAB — BASIC METABOLIC PANEL
ANION GAP: 14 (ref 5–15)
BUN: 107 mg/dL — ABNORMAL HIGH (ref 6–23)
CALCIUM: 7.4 mg/dL — AB (ref 8.4–10.5)
CO2: 17 meq/L — AB (ref 19–32)
Chloride: 114 mEq/L — ABNORMAL HIGH (ref 96–112)
Creatinine, Ser: 5.72 mg/dL — ABNORMAL HIGH (ref 0.50–1.10)
GFR calc Af Amer: 8 mL/min — ABNORMAL LOW (ref >90)
GFR calc non Af Amer: 7 mL/min — ABNORMAL LOW (ref >90)
Glucose, Bld: 176 mg/dL — ABNORMAL HIGH (ref 70–99)
POTASSIUM: 4.3 meq/L (ref 3.7–5.3)
SODIUM: 145 meq/L (ref 137–147)

## 2014-08-12 LAB — TYPE AND SCREEN
ABO/RH(D): B NEG
Antibody Screen: NEGATIVE
Unit division: 0

## 2014-08-12 MED ORDER — FUROSEMIDE 10 MG/ML IJ SOLN
40.0000 mg | Freq: Once | INTRAMUSCULAR | Status: AC
  Administered 2014-08-12: 40 mg via INTRAVENOUS
  Filled 2014-08-12: qty 4

## 2014-08-12 NOTE — Progress Notes (Signed)
CRITICAL VALUE ALERT  Critical value received:  Hbg  6.8  Date of notification:  08/12/2014  Time of notification:  0330  Critical value read back:Yes.    Nurse who received alert:  G. Venson Ferencz/ RN  MD notified (1st page):  Dr. Darrick Pennaeterding  Time of first page:  0330  MD notified (2nd page):  Time of second page:  Responding MD:  Dr. Darrick Pennaeterding  Time MD responded:  0330

## 2014-08-12 NOTE — Progress Notes (Signed)
PULMONARY / CRITICAL CARE MEDICINE   Name: [name] MRN: [id number] DOB: Apr 03, 1946    ADMISSION DATE:  08/08/2014  REFERRING MD :  EDP  CHIEF COMPLAINT:  AMS  INITIAL PRESENTATION:  68 yo female found unresponsive at home.  In ED noted to have hypotension, bradycardia, hypothermia likely in setting of UTI and hypothyroidism.  She was intubated for airway protection, and PCCM asked to admit.  She has hx of advanced dementia.  STUDIES:  8/18 CT head >> age related atrophy 8/18 CT C spine >> widened C4-C5 facet joints  SIGNIFICANT EVENTS: 8/18 Fall from toilet, brady on pacer pads, intubated in ED. Admitted to ICU 8/19 Off pressors 8/20 brady improved, DNR established  SUBJECTIVE:  Anemic this am.   VITAL SIGNS: Temp:  [97.3 F (36.3 C)-99.8 F (37.7 C)] 97.3 F (36.3 C) (08/22 1200) Pulse Rate:  [61-79] 70 (08/22 1217) Resp:  [11-23] 18 (08/22 1217) BP: (126-145)/(48-81) 140/54 mmHg (08/22 1217) SpO2:  [95 %-100 %] 100 % (08/22 1217) FiO2 (%):  [40 %] 40 % (08/22 1217) VENTILATOR SETTINGS: Vent Mode:  [-] PSV FiO2 (%):  [40 %] 40 % Set Rate:  [20 bmp] 20 bmp Vt Set:  [400 mL] 400 mL PEEP:  [5 cmH20] 5 cmH20 Pressure Support:  [5 cmH20-10 cmH20] 10 cmH20 Plateau Pressure:  [11 cmH20-17 cmH20] 16 cmH20 INTAKE / OUTPUT:  Intake/Output Summary (Last 24 hours) at 08/12/14 1547 Last data filed at 08/12/14 1244  Gross per 24 hour  Intake   4960 ml  Output   1875 ml  Net   3085 ml    PHYSICAL EXAMINATION: General: ill appearing Neuro: opens eyes, does not follow commands, easily drifts back to sleep  HEENT: Lt eye/scalp laceration healing, C collar in place Cardiovascular: s1 s2 regular, no murmur Lungs: resps even non labored on PS 10/5, coarse Abdomen:  Soft, nontender Musculoskeletal:  1+ non pitting edema, 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 Skin: no rashes  LABS:  CBC  Recent Labs Lab 08/11/14 0430 08/11/14 1241 08/12/14 0308  WBC 7.6 8.4  8.4  HGB 6.2* 7.5* 6.8*  HCT 19.3* 23.8* 20.6*  PLT 155 148* 147*   BMET  Recent Labs Lab 08/10/14 1500 08/11/14 0430 08/12/14 0308  NA 153* 151* 145  K 4.8 4.7 4.3  CL 127* 125* 114*  CO2 13* 15* 17*  BUN 106* 106* 107*  CREATININE 5.90* 5.92* 5.72*  GLUCOSE 124* 88 176*   Electrolytes  Recent Labs Lab 08/10/14 0430 08/10/14 1500 08/11/14 0430 08/12/14 0308  CALCIUM 8.4 8.2* 7.9* 7.4*  MG 1.9  --   --   --   PHOS 5.1*  --   --   --    Sepsis Markers  Recent Labs Lab 08/08/14 2205 08/09/14 0025 08/09/14 0255 08/09/14 1240 08/10/14 0430  LATICACIDVEN 1.13  --  0.7 0.8  --   PROCALCITON  --  <0.10  --   --  0.29   ABG  Recent Labs Lab 08/10/14 0353 08/10/14 1520 08/11/14 1208  PHART 7.297* 7.257* 7.305*  PCO2ART 28.8* 32.9* 31.4*  PO2ART 55.0* 146.0* 103.0*   Liver Enzymes  Recent Labs Lab 08/08/14 2120 08/11/14 0430  AST 159* 102*  ALT 176* 210*  ALKPHOS 249* 283*  BILITOT <0.2* <0.2*  ALBUMIN 2.4* 1.9*   Cardiac Enzymes  Recent Labs Lab 08/09/14 0153 08/09/14 0300  TROPONINI <0.30 <0.30   Glucose  Recent Labs Lab 08/11/14 1522 08/11/14 1946 08/11/14  2333 08/12/14 0416 08/12/14 0757 08/12/14 1135  GLUCAP 106* 138* 141* 157* 132* 171*    Imaging No results found.   ASSESSMENT / PLAN:  PULMONARY ETT 8/18 >>  A: Acute respiratory failure 2nd to encephalopathy and inability to protect airway. interstitial edema  P:   Cont PS wean as tol  Mental status does not yet support extubation > probably 8/24 When we consider extubation, will have discussion on reintubation status Lasix x 1 8/22  pcxr in am    CARDIOVASCULAR Rt femoral CVL 8/18 >>  A:  Hypovolemic/septic shock >> improved. Bradycardia 2nd to hypothyroidism/hypothermia >> improved. Hx of HTN. P:  Monitor hemodynamics Hold outpt norvasc, lasix, lopressor Pressors would be medically ineffective   RENAL A:   Acute on CKD - baseline creatinine  approximately 5 >> do not think she is candidate for HD given baseline dementia. Hypernatremia. Hyperchloremic metabolic acidosis, NON-AG P:   F/u BMETin am  Lasix x 1 8/22  GASTROINTESTINAL A:   Elevated transaminases likely related to shock. Nutrition. P:   Tube feeds while on vent Protonix for SUP  HEMATOLOGIC A:   Anemia of chronic disease and critical illness. No obvious s/s bleeding  P:  F/u cbc  SQ heparin for DVT prevention Tx 1 unit done 8/21 - hold further tx for now   INFECTIOUS A:   Sepsis with UTI.  Hx of PCN allergy. RN concerned BV P:   azactam started 8/18>>>add stop date 8/24 Urine 8/19>>>lactobacillus (unlikley pathogen) Blood 8/18 >>  Treat empiric BV, flagyl  ENDOCRINE A:   Hx of DM type II with episodes of hypoglycemia. Hypothyroidism -- TSH 9.75 Cortisol 20.9 P:   SSI Continue synthroid  25 mcg IV , keep higher than home dose with elevated tsh    NEUROLOGIC A:   Acute metabolic encephalopathy. Hx of advanced dementia. P:   RASS goal: 0 Hold outpt xanax, elavil Keep C collar in place for now Avoid benzo  Goals of Care >>DNR, meds atropine, ok per family  Global:  DNR noted, HD, any forms of life support, reintubation would be medically ineffective therapy and futile, will discuss reintubation status with family.  If mental status continues to improve consider extubation in am once limitations set.    Dirk DressKaty Whiteheart, NP 08/12/2014  3:47 PM Pager: [phone]) [phone] or [phone]  *Care during the described time interval was provided by me and/or other providers on the critical care team. I have reviewed this patient's available data, including medical history, events of note, physical examination and test results as part of my evaluation.  40 minutes CC time   Levy Pupaobert Malayzia Laforte, MD, PhD 08/12/2014, 4:02 PM  [facility] [phone] or if no answer [phone]

## 2014-08-13 ENCOUNTER — Inpatient Hospital Stay (HOSPITAL_COMMUNITY): Payer: Medicare Other

## 2014-08-13 LAB — CBC
HCT: 22.5 % — ABNORMAL LOW (ref 36.0–46.0)
Hemoglobin: 7.4 g/dL — ABNORMAL LOW (ref 12.0–15.0)
MCH: 27.5 pg (ref 26.0–34.0)
MCHC: 32.9 g/dL (ref 30.0–36.0)
MCV: 83.6 fL (ref 78.0–100.0)
PLATELETS: 171 10*3/uL (ref 150–400)
RBC: 2.69 MIL/uL — ABNORMAL LOW (ref 3.87–5.11)
RDW: 18.4 % — AB (ref 11.5–15.5)
WBC: 8.3 10*3/uL (ref 4.0–10.5)

## 2014-08-13 LAB — BASIC METABOLIC PANEL
ANION GAP: 15 (ref 5–15)
BUN: 114 mg/dL — ABNORMAL HIGH (ref 6–23)
CALCIUM: 7.8 mg/dL — AB (ref 8.4–10.5)
CO2: 17 mEq/L — ABNORMAL LOW (ref 19–32)
Chloride: 110 mEq/L (ref 96–112)
Creatinine, Ser: 5.7 mg/dL — ABNORMAL HIGH (ref 0.50–1.10)
GFR calc Af Amer: 8 mL/min — ABNORMAL LOW (ref >90)
GFR calc non Af Amer: 7 mL/min — ABNORMAL LOW (ref >90)
Glucose, Bld: 173 mg/dL — ABNORMAL HIGH (ref 70–99)
Potassium: 3.7 mEq/L (ref 3.7–5.3)
Sodium: 142 mEq/L (ref 137–147)

## 2014-08-13 LAB — GLUCOSE, CAPILLARY
GLUCOSE-CAPILLARY: 117 mg/dL — AB (ref 70–99)
GLUCOSE-CAPILLARY: 149 mg/dL — AB (ref 70–99)
Glucose-Capillary: 154 mg/dL — ABNORMAL HIGH (ref 70–99)
Glucose-Capillary: 155 mg/dL — ABNORMAL HIGH (ref 70–99)
Glucose-Capillary: 136 mg/dL — ABNORMAL HIGH (ref 70–99)
Glucose-Capillary: 137 mg/dL — ABNORMAL HIGH (ref 70–99)

## 2014-08-13 MED ORDER — LEVOTHYROXINE SODIUM 50 MCG PO TABS
50.0000 ug | ORAL_TABLET | Freq: Every day | ORAL | Status: DC
  Administered 2014-08-13 – 2014-08-14 (×2): 50 ug
  Filled 2014-08-13 (×3): qty 1

## 2014-08-13 NOTE — Progress Notes (Signed)
PULMONARY / CRITICAL CARE MEDICINE   Name: [name] MRN: [id number] DOB: 12-30-1945    ADMISSION DATE:  08/08/2014  REFERRING MD :  EDP  CHIEF COMPLAINT:  AMS  INITIAL PRESENTATION:  68 yo female found unresponsive at home.  In ED noted to have hypotension, bradycardia, hypothermia likely in setting of UTI and hypothyroidism.  She was intubated for airway protection, and PCCM asked to admit.  She has hx of advanced dementia.  STUDIES:  8/18 CT head >> age related atrophy 8/18 CT C spine >> widened C4-C5 facet joints  SIGNIFICANT EVENTS: 8/18 Fall from toilet, brady on pacer pads, intubated in ED. Admitted to ICU 8/19 Off pressors 8/20 brady improved, DNR established  SUBJECTIVE:  Has tolerated some PSV, was on PS 12, now back on PRVC due to agitation  VITAL SIGNS: Temp:  [96.8 F (36 C)-98 F (36.7 C)] 97.8 F (36.6 C) (08/23 1100) Pulse Rate:  [51-83] 62 (08/23 1132) Resp:  [0-29] 20 (08/23 1132) BP: (124-170)/(42-138) 128/44 mmHg (08/23 1132) SpO2:  [96 %-100 %] 100 % (08/23 1132) FiO2 (%):  [40 %] 40 % (08/23 1132) Weight:  [76.1 kg (167 lb 12.3 oz)] 76.1 kg (167 lb 12.3 oz) (08/23 0500) VENTILATOR SETTINGS: Vent Mode:  [-] PRVC FiO2 (%):  [40 %] 40 % Set Rate:  [20 bmp] 20 bmp Vt Set:  [400 mL] 400 mL PEEP:  [5 cmH20] 5 cmH20 Pressure Support:  [10 cmH20] 10 cmH20 Plateau Pressure:  [15 cmH20-18 cmH20] 15 cmH20 INTAKE / OUTPUT:  Intake/Output Summary (Last 24 hours) at 08/13/14 1221 Last data filed at 08/13/14 1100  Gross per 24 hour  Intake   2670 ml  Output   2170 ml  Net    500 ml    PHYSICAL EXAMINATION: General: ill appearing Neuro: opens eyes, does not follow commands, easily drifts back to sleep  HEENT: Lt eye/scalp laceration healing, C collar in place Cardiovascular: s1 s2 regular, no murmur Lungs: resps even non labored on PS 10/5, coarse Abdomen:  Soft, nontender Musculoskeletal:  1+ non pitting edema, S/p amputation of 2 toes on R  foot Skin: no rashes  LABS:  CBC  Recent Labs Lab 08/11/14 1241 08/12/14 0308 08/13/14 0450  WBC 8.4 8.4 8.3  HGB 7.5* 6.8* 7.4*  HCT 23.8* 20.6* 22.5*  PLT 148* 147* 171   BMET  Recent Labs Lab 08/11/14 0430 08/12/14 0308 08/13/14 0450  NA 151* 145 142  K 4.7 4.3 3.7  CL 125* 114* 110  CO2 15* 17* 17*  BUN 106* 107* 114*  CREATININE 5.92* 5.72* 5.70*  GLUCOSE 88 176* 173*   Electrolytes  Recent Labs Lab 08/10/14 0430  08/11/14 0430 08/12/14 0308 08/13/14 0450  CALCIUM 8.4  < > 7.9* 7.4* 7.8*  MG 1.9  --   --   --   --   PHOS 5.1*  --   --   --   --   < > = values in this interval not displayed. Sepsis Markers  Recent Labs Lab 08/08/14 2205 08/09/14 0025 08/09/14 0255 08/09/14 1240 08/10/14 0430  LATICACIDVEN 1.13  --  0.7 0.8  --   PROCALCITON  --  <0.10  --   --  0.29   ABG  Recent Labs Lab 08/10/14 0353 08/10/14 1520 08/11/14 1208  PHART 7.297* 7.257* 7.305*  PCO2ART 28.8* 32.9* 31.4*  PO2ART 55.0* 146.0* 103.0*   Liver Enzymes  Recent Labs Lab 08/08/14 2120 08/11/14 0430  AST 159*  102*  ALT 176* 210*  ALKPHOS 249* 283*  BILITOT <0.2* <0.2*  ALBUMIN 2.4* 1.9*   Cardiac Enzymes  Recent Labs Lab 08/09/14 0153 08/09/14 0300  TROPONINI <0.30 <0.30   Glucose  Recent Labs Lab 08/12/14 1536 08/12/14 1950 08/13/14 0012 08/13/14 0412 08/13/14 0723 08/13/14 1125  GLUCAP 148* 158* 149* 154* 155* 136*    Imaging No results found.   ASSESSMENT / PLAN:  PULMONARY ETT 8/18 >>  A: Acute respiratory failure 2nd to encephalopathy and inability to protect airway. interstitial edema  P:   Cont PS wean as tol  Mental status and WOB improved but still likely not stable for extubation.  When we consider extubation we will need to discuss goals, reintubation. Palliative Care consult has been made pcxr in am    CARDIOVASCULAR Rt femoral CVL 8/18 >>  A:  Hypovolemic/septic shock >> improved. Bradycardia 2nd to  hypothyroidism/hypothermia >> improved. Hx of HTN. P:  Monitor hemodynamics Hold outpt norvasc, lasix, lopressor Pressors would be medically ineffective   RENAL A:   Acute on CKD - baseline creatinine approximately 5 >> do not think she is candidate for HD given baseline dementia. Hypernatremia. Hyperchloremic metabolic acidosis, NON-AG P:   F/u BMETin am   GASTROINTESTINAL A:   Elevated transaminases likely related to shock. Nutrition. P:   Tube feeds while on vent Protonix for SUP  HEMATOLOGIC A:   Anemia of chronic disease and critical illness. No obvious s/s bleeding  P:  F/u cbc  SQ heparin for DVT prevention Tx 1 unit done 8/21 - hold further tx for now   INFECTIOUS A:   Sepsis with UTI.  Hx of PCN allergy. RN concerned BV P:   azactam started 8/18>>>add stop date 8/24 Urine 8/19>>>lactobacillus (unlikley pathogen) Blood 8/18 >>  Treat empiric BV, flagyl  ENDOCRINE A:   Hx of DM type II with episodes of hypoglycemia. Hypothyroidism -- TSH 9.75 Cortisol 20.9 P:   SSI Continue synthroid  25 mcg IV , keep higher than home dose with elevated tsh    NEUROLOGIC A:   Acute metabolic encephalopathy. Hx of advanced dementia. P:   RASS goal: 0 Hold outpt xanax, elavil Keep C collar in place for now Avoid benzo  Goals of Care >>DNR, meds atropine, ok per family  Global:  DNR noted, HD, any forms of life support, reintubation would be medically ineffective therapy and futile, will discuss reintubation status with family > have not seen family 8/23.  If mental status continues to improve consider extubation once limitations set.   *Care during the described time interval was provided by me and/or other providers on the critical care team. I have reviewed this patient's available data, including medical history, events of note, physical examination and test results as part of my evaluation.  30 minutes CC time   Levy Pupa, MD, PhD 08/13/2014, 12:21  PM [facility] [phone] or if no answer [phone]

## 2014-08-14 DIAGNOSIS — Z515 Encounter for palliative care: Secondary | ICD-10-CM

## 2014-08-14 LAB — CBC
HEMATOCRIT: 20.3 % — AB (ref 36.0–46.0)
HEMOGLOBIN: 6.8 g/dL — AB (ref 12.0–15.0)
MCH: 28.3 pg (ref 26.0–34.0)
MCHC: 33.5 g/dL (ref 30.0–36.0)
MCV: 84.6 fL (ref 78.0–100.0)
Platelets: 147 10*3/uL — ABNORMAL LOW (ref 150–400)
RBC: 2.4 MIL/uL — AB (ref 3.87–5.11)
RDW: 18.2 % — ABNORMAL HIGH (ref 11.5–15.5)
WBC: 5.5 10*3/uL (ref 4.0–10.5)

## 2014-08-14 LAB — BASIC METABOLIC PANEL
Anion gap: 16 — ABNORMAL HIGH (ref 5–15)
BUN: 116 mg/dL — AB (ref 6–23)
CHLORIDE: 112 meq/L (ref 96–112)
CO2: 18 meq/L — AB (ref 19–32)
CREATININE: 5.75 mg/dL — AB (ref 0.50–1.10)
Calcium: 7.5 mg/dL — ABNORMAL LOW (ref 8.4–10.5)
GFR calc Af Amer: 8 mL/min — ABNORMAL LOW (ref >90)
GFR calc non Af Amer: 7 mL/min — ABNORMAL LOW (ref >90)
GLUCOSE: 134 mg/dL — AB (ref 70–99)
Potassium: 3.8 mEq/L (ref 3.7–5.3)
Sodium: 146 mEq/L (ref 137–147)

## 2014-08-14 LAB — GLUCOSE, CAPILLARY
GLUCOSE-CAPILLARY: 108 mg/dL — AB (ref 70–99)
GLUCOSE-CAPILLARY: 157 mg/dL — AB (ref 70–99)
Glucose-Capillary: 125 mg/dL — ABNORMAL HIGH (ref 70–99)
Glucose-Capillary: 100 mg/dL — ABNORMAL HIGH (ref 70–99)
Glucose-Capillary: 157 mg/dL — ABNORMAL HIGH (ref 70–99)
Glucose-Capillary: 187 mg/dL — ABNORMAL HIGH (ref 70–99)

## 2014-08-14 LAB — T3, REVERSE: T3 REVERSE: 9 ng/dL (ref 8–25)

## 2014-08-14 LAB — PREPARE RBC (CROSSMATCH)

## 2014-08-14 MED ORDER — FENTANYL CITRATE 0.05 MG/ML IJ SOLN
12.5000 ug | INTRAMUSCULAR | Status: DC | PRN
  Administered 2014-08-17: 25 ug via INTRAVENOUS
  Filled 2014-08-14: qty 2

## 2014-08-14 MED ORDER — METHYLPREDNISOLONE SODIUM SUCC 40 MG IJ SOLR
40.0000 mg | Freq: Once | INTRAMUSCULAR | Status: AC
  Administered 2014-08-14: 40 mg via INTRAVENOUS
  Filled 2014-08-14: qty 1

## 2014-08-14 MED ORDER — RACEPINEPHRINE HCL 2.25 % IN NEBU
INHALATION_SOLUTION | RESPIRATORY_TRACT | Status: AC
  Filled 2014-08-14: qty 0.5

## 2014-08-14 MED ORDER — SODIUM CHLORIDE 0.9 % IV SOLN: Freq: Once | INTRAVENOUS | Status: DC

## 2014-08-14 MED ORDER — RACEPINEPHRINE HCL 2.25 % IN NEBU
0.5000 mL | INHALATION_SOLUTION | Freq: Once | RESPIRATORY_TRACT | Status: AC
  Administered 2014-08-14: 0.5 mL via RESPIRATORY_TRACT

## 2014-08-14 MED ORDER — LEVOTHYROXINE SODIUM 100 MCG IV SOLR
25.0000 ug | Freq: Every day | INTRAVENOUS | Status: DC
  Filled 2014-08-14: qty 5

## 2014-08-14 MED ORDER — DEXTROSE 5 % IV SOLN
INTRAVENOUS | Status: DC
  Administered 2014-08-14: 16:00:00 via INTRAVENOUS

## 2014-08-14 NOTE — Progress Notes (Signed)
eLink Physician-Brief Progress Note Patient Name: [name] DOB: 03-11-1946 MRN: [id number]   Date of Service  08/14/2014  HPI/Events of Note  hgb 6.8 , no bleeding, hemodynamics good  eICU Interventions  Repeat cbc at 11 am      Intervention Category Minor Interventions: Communication with other healthcare providers and/or family;Routine modifications to care plan (e.g. PRN medications for pain, fever)  Nelda Bucks. 08/14/2014, 5:54 AM

## 2014-08-14 NOTE — Procedures (Signed)
**Note De-Identified [name] Obfuscation** Extubation Procedure Note  Patient Details:   Name: [name] DOB: 05/13/1946 MRN: [id number]   Airway Documentation:     Evaluation  O2 sats: stable throughout Complications: No apparent complications Patient did tolerate procedure well. Bilateral Breath Sounds: Clear;Diminished Suctioning: Oral;Airway Yes No, stridor noted, + leak Qunicy Higinbotham, Megan Salon 08/14/2014, 10:09 AM

## 2014-08-14 NOTE — Progress Notes (Signed)
Aspen collar removed at direction of Dr. Annalee Genta RN, BSN, CCRN

## 2014-08-14 NOTE — Progress Notes (Signed)
PULMONARY / CRITICAL CARE MEDICINE   Name: [name] MRN: [id number] DOB: Nov 10, 1946    ADMISSION DATE:  08/08/2014  REFERRING MD :  EDP  CHIEF COMPLAINT:  AMS  INITIAL PRESENTATION:  68 yo female found unresponsive at home.  In ED noted to have hypotension, bradycardia, hypothermia likely in setting of UTI and hypothyroidism.  She was intubated for airway protection, and PCCM asked to admit.  She has hx of advanced dementia.  STUDIES:  8/18 CT head >> age related atrophy 8/18 CT C spine >> widened C4-C5 facet joints  SIGNIFICANT EVENTS: 8/18 Fall from toilet, brady on pacer pads, intubated in ED. Admitted to ICU 8/19 Off pressors 8/20 brady improved, DNR established 8/21 one unit PRBCs transfused for Hgb 6.8 8/24 Extubated 8/24 one unit PRBCs transfused for Hgb 6.8  SUBJECTIVE:  Passed SBT. Extubated and tolerating. Mild stridor. Received methylpred and racemic epi  VITAL SIGNS: Temp:  [98.1 F (36.7 C)-100.1 F (37.8 C)] 98.1 F (36.7 C) (08/24 1415) Pulse Rate:  [43-84] 84 (08/24 1415) Resp:  [0-24] 16 (08/24 1415) BP: (126-163)/(44-111) 156/64 mmHg (08/24 1400) SpO2:  [87 %-100 %] 97 % (08/24 1415) FiO2 (%):  [40 %] 40 % (08/24 0923) Weight:  [76 kg (167 lb 8.8 oz)] 76 kg (167 lb 8.8 oz) (08/24 0400) VENTILATOR SETTINGS: Vent Mode:  [-] PSV;CPAP FiO2 (%):  [40 %] 40 % Set Rate:  [20 bmp] 20 bmp Vt Set:  [400 mL] 400 mL PEEP:  [5 cmH20] 5 cmH20 Plateau Pressure:  [15 cmH20-16 cmH20] 16 cmH20 INTAKE / OUTPUT:  Intake/Output Summary (Last 24 hours) at 08/14/14 1514 Last data filed at 08/14/14 1415  Gross per 24 hour  Intake   2315 ml  Output   1555 ml  Net    760 ml    PHYSICAL EXAMINATION: General: ill appearing Neuro: RASS -1 to 0. +/- F/C HEENT: Lt eye/scalp laceration healing Cardiovascular: Reg, no M Lungs: clear anteriorly Abdomen:  Soft, nontender, +BS Ext: symmetric mild edema   LABS: I have reviewed all of today's lab results.  Relevant abnormalities are discussed in the A/P section  CXR: NNF  ASSESSMENT / PLAN:  PULMONARY ETT 8/18 >> 8/24 A: Acute respiratory failure 2nd to encephalopathy and inability to protect airway. interstitial edema  P:   Monitor in ICU post ext Supp O2 to maintain SpO2 > 90% Need to clarify re-intubation status  CARDIOVASCULAR Rt femoral CVL 8/18 >>  A:  Hypovolemic/septic shock, resolved Bradycardia, resolved Hx of HTN. P:  Monitor hemodynamics Holding outpt norvasc, lasix, lopressor  RENAL A:   AKI, non oliguric  CKD 9baseline Cr approx 5) Hypernatremia. NAG acidosis P:   Monitor BMET intermittently Monitor I/Os Correct electrolytes as indicated Not a candidate for HD  GASTROINTESTINAL A:   Elevated transaminases P:   SUP N/I post ext NPO post ext  HEMATOLOGIC A:   Anemia of chronic disease and critical illness. No obvious s/s bleeding  P:  DVT px: SCDs (SQ heparin DC'd 8/24) Monitor CBC intermittently Transfuse per usual ICU guidelines  INFECTIOUS A:   Sepsis with UTI.  Hx of PCN allergy. P:   azactam started 8/18 >>  Urine 8/19>>>lactobacillus (unlikley pathogen) Blood 8/18 >> NEG  ENDOCRINE A:   Hx of DM type II with episodes of hypoglycemia. Hypothyroidism -- TSH 9.75 P:   Cont sens scale SSI Continue synthroid IV , keep higher than home dose with elevated tsh    NEUROLOGIC A:  Acute metabolic encephalopathy. Hx of advanced dementia. P:   RASS goal: 0 Holding outpt xanax, elavil OK to remove C collar  DNR reconfirmed with family. Did not address specifically the issue of re-intubation    35 minutes CC time   Billy Fischer, MD ; [facility] [phone].  After 5:30 PM or weekends, call [phone]

## 2014-08-14 NOTE — Progress Notes (Addendum)
Patient [name]      DOB: Feb 20, 1946      QIO:[id number]   Palliative Medicine Team at [facility] Progress Note    Subjective: Patient awakens easily to calling her name . When I told her she was beautiful , she mouthed thank you. Will continue to support family who desire to treat the treatable but have been realistic about interventions if she declined. Filed Vitals:   08/14/14 0700  BP: 140/91  Pulse: 48  Temp: 98.9 F (37.2 C)  Resp: 17   Physical exam:   General: no acute distress remains intubated but is awake and understands what I am saying PRR on the right , left eye still slightly swollen where she fell Chest : decreased , no RRW CVS: bradycardic, S1, S2 Abd: soft, no grimace on palpation Ext: trace non pitting edema bilaterally Neuro: more awake and alert than last week when I saw her  Lab Results  Component Value Date   WBC 5.5 08/14/2014   HGB 6.8* 08/14/2014   HCT 20.3* 08/14/2014   MCV 84.6 08/14/2014   PLT 147* 08/14/2014   Lab Results  Component Value Date   CREATININE 5.75* 08/14/2014   BUN 116* 08/14/2014   NA 146 08/14/2014   K 3.8 08/14/2014   CL 112 08/14/2014   CO2 18* 08/14/2014     Assessment and plan: 68 yr old african [place] female, syncopized while on toilet. Found to be bradycardic with UTI and sepsis.  Patient more awake and alert today.  Noted events over the weekend.  Family had requested to treat the treatable but were realistic about not putting her through significant distress with CPR, or trans cutaneous pacemaker.  1.  No CPR, No transcutaneous pacing.  Please use meds if bradycardia symptomatic and regoal with family. Treating hypothyroidism  2. Sepsis: treating    3. Respiratory failure due to sepsis:  Per CCM.    Total time 730 am-745 am  Erika Mcleish L. Ladona Ridgel, MD MBA The Palliative Medicine Team at [facility] Phone: [phone] Pager: [phone] ( Use team phone after hours)

## 2014-08-14 NOTE — Progress Notes (Signed)
Critical Lab Results called to [facility], Result taken by: Vinnie Langton, RN  Critical Lab: hgb 6.8  Time Called: 1610  Marvia Pickles, RN

## 2014-08-15 ENCOUNTER — Inpatient Hospital Stay (HOSPITAL_COMMUNITY): Payer: Medicare Other

## 2014-08-15 DIAGNOSIS — R4182 Altered mental status, unspecified: Secondary | ICD-10-CM

## 2014-08-15 DIAGNOSIS — F0390 Unspecified dementia without behavioral disturbance: Secondary | ICD-10-CM

## 2014-08-15 LAB — GLUCOSE, CAPILLARY
GLUCOSE-CAPILLARY: 124 mg/dL — AB (ref 70–99)
GLUCOSE-CAPILLARY: 171 mg/dL — AB (ref 70–99)
Glucose-Capillary: 88 mg/dL (ref 70–99)
Glucose-Capillary: 94 mg/dL (ref 70–99)
Glucose-Capillary: 105 mg/dL — ABNORMAL HIGH (ref 70–99)
Glucose-Capillary: 105 mg/dL — ABNORMAL HIGH (ref 70–99)
Glucose-Capillary: 86 mg/dL (ref 70–99)

## 2014-08-15 LAB — BASIC METABOLIC PANEL
ANION GAP: 18 — AB (ref 5–15)
BUN: 120 mg/dL — ABNORMAL HIGH (ref 6–23)
CHLORIDE: 110 meq/L (ref 96–112)
CO2: 17 meq/L — AB (ref 19–32)
Calcium: 8.3 mg/dL — ABNORMAL LOW (ref 8.4–10.5)
Creatinine, Ser: 5.79 mg/dL — ABNORMAL HIGH (ref 0.50–1.10)
GFR calc Af Amer: 8 mL/min — ABNORMAL LOW (ref >90)
GFR calc non Af Amer: 7 mL/min — ABNORMAL LOW (ref >90)
Glucose, Bld: 119 mg/dL — ABNORMAL HIGH (ref 70–99)
Potassium: 3.9 mEq/L (ref 3.7–5.3)
Sodium: 145 mEq/L (ref 137–147)

## 2014-08-15 LAB — CULTURE, BLOOD (ROUTINE X 2)
Culture: NO GROWTH
Culture: NO GROWTH

## 2014-08-15 LAB — TYPE AND SCREEN
ABO/RH(D): B NEG
Antibody Screen: NEGATIVE
Unit division: 0

## 2014-08-15 LAB — CBC
HCT: 25.2 % — ABNORMAL LOW (ref 36.0–46.0)
HEMOGLOBIN: 8.7 g/dL — AB (ref 12.0–15.0)
MCH: 28 pg (ref 26.0–34.0)
MCHC: 34.5 g/dL (ref 30.0–36.0)
MCV: 81 fL (ref 78.0–100.0)
Platelets: 144 10*3/uL — ABNORMAL LOW (ref 150–400)
RBC: 3.11 MIL/uL — ABNORMAL LOW (ref 3.87–5.11)
RDW: 17.4 % — ABNORMAL HIGH (ref 11.5–15.5)
WBC: 6.3 10*3/uL (ref 4.0–10.5)

## 2014-08-15 MED ORDER — AMITRIPTYLINE HCL 50 MG PO TABS
50.0000 mg | ORAL_TABLET | Freq: Every day | ORAL | Status: DC
  Administered 2014-08-16 – 2014-08-18 (×3): 50 mg via ORAL
  Filled 2014-08-15 (×7): qty 1

## 2014-08-15 MED ORDER — AMLODIPINE BESYLATE 10 MG PO TABS
10.0000 mg | ORAL_TABLET | Freq: Every day | ORAL | Status: DC
  Administered 2014-08-15 – 2014-08-19 (×4): 10 mg via ORAL
  Filled 2014-08-15 (×2): qty 1
  Filled 2014-08-15: qty 2
  Filled 2014-08-15 (×3): qty 1

## 2014-08-15 MED ORDER — ALPRAZOLAM 0.25 MG PO TABS
0.2500 mg | ORAL_TABLET | Freq: Every evening | ORAL | Status: DC | PRN
Start: 2014-08-15 — End: 2014-08-19

## 2014-08-15 MED ORDER — INSULIN ASPART 100 UNIT/ML ~~LOC~~ SOLN: 0.0000 [IU] | Freq: Every day | SUBCUTANEOUS | Status: DC

## 2014-08-15 MED ORDER — LEVOTHYROXINE SODIUM 50 MCG PO TABS
50.0000 ug | ORAL_TABLET | Freq: Every day | ORAL | Status: DC
  Filled 2014-08-15 (×3): qty 1

## 2014-08-15 MED ORDER — LEVOTHYROXINE SODIUM 25 MCG PO TABS
25.0000 ug | ORAL_TABLET | Freq: Once | ORAL | Status: AC
  Administered 2014-08-15: 25 ug via ORAL
  Filled 2014-08-15: qty 1

## 2014-08-15 MED ORDER — INSULIN ASPART 100 UNIT/ML ~~LOC~~ SOLN
0.0000 [IU] | Freq: Three times a day (TID) | SUBCUTANEOUS | Status: DC
  Administered 2014-08-18 (×2): 2 [IU] via SUBCUTANEOUS

## 2014-08-15 MED ORDER — CETYLPYRIDINIUM CHLORIDE 0.05 % MT LIQD
7.0000 mL | Freq: Two times a day (BID) | OROMUCOSAL | Status: DC
  Administered 2014-08-15 – 2014-08-16 (×3): 7 mL via OROMUCOSAL

## 2014-08-15 NOTE — Progress Notes (Signed)
Patient [name]      DOB: [birth year]      NOI:[id number]   Palliative Medicine Team at [facility] Progress Note    Subjective: Checked in on patient this am.  [name] is pleasantly confused.  I spoke with her daughter [name]. She is going to met up with ne tomorrow around 9 on her way to work.  [name] states that this is about mom's baseline level of confusion with her dementia.  Dr. Alva Garnet has negotiated a full DNR with no reintubation per our conversation this am.   Filed Vitals:   08/15/14 1000  BP: 144/101  Pulse: 73  Temp: 97.2 F (36.2 C)  Resp: 16   Physical exam: General: no real distress, she has hyperactive behaviors with her dementia so she picks and looks around and keeps moving.  Upper air way stridor still present, left eye less edema, laceration repaired Chest decreased CVS: regular S1, S2 Abd: soft, not tender Ext : warm, trace edema not pitting in feet Neuro: very confused, but can speak some sentences , just don't make sense   Assessment and plan: 68 yr old african [place] female with advanced stage 7 dementia  1.  DNR no reintubation  2.  Wheezing: received steroids and nebs  3.  Infection: was being treated for UTI but urine culture growing Lactobacillus. Remains on Aztreonam.  Family has expressed to treat the treatable.  Daughter to catch up with me before work in the am.  Total time: 15 min  [name] L. Erika Le, MD MBA The Palliative Medicine Team at [facility] Phone: [phone] Pager: [phone] ( Use team phone after hours)

## 2014-08-15 NOTE — Progress Notes (Signed)
PULMONARY / CRITICAL CARE MEDICINE   Name: [name] MRN: [id number] DOB: [birth year]    ADMISSION DATE:  08/08/2014  REFERRING MD :  EDP  CHIEF COMPLAINT:  AMS  INITIAL PRESENTATION:  68 yo female found unresponsive at home.  In ED noted to have hypotension, bradycardia, hypothermia likely in setting of UTI and hypothyroidism.  She was intubated for airway protection, and PCCM asked to admit.  She has hx of advanced dementia.  STUDIES:  8/18 CT head >> age related atrophy 8/18 CT C spine >> widened C4-C5 facet joints  SIGNIFICANT EVENTS: 8/18 Fall from toilet, brady on pacer pads, intubated in ED. Admitted to ICU 8/19 Off pressors 8/20 brady improved, DNR established 8/21 one unit PRBCs transfused for Hgb 6.8 8/24 Extubated 8/24 one unit PRBCs transfused for Hgb 6.8  SUBJECTIVE:  No distress. Stridor improved. No complaints  VITAL SIGNS: Temp:  [97 F (36.1 C)-98.2 F (36.8 C)] 97.2 F (36.2 C) (08/25 1000) Pulse Rate:  [63-75] 73 (08/25 1000) Resp:  [9-21] 16 (08/25 1000) BP: (129-174)/(65-135) 144/101 mmHg (08/25 1000) SpO2:  [92 %-100 %] 93 % (08/25 1000) FiO2 (%):  [45 %] 45 % (08/24 1600) Weight:  [75.6 kg (166 lb 10.7 oz)] 75.6 kg (166 lb 10.7 oz) (08/25 0500) VENTILATOR SETTINGS: Vent Mode:  [-]  FiO2 (%):  [45 %] 45 % INTAKE / OUTPUT:  Intake/Output Summary (Last 24 hours) at 08/15/14 1544 Last data filed at 08/15/14 0900  Gross per 24 hour  Intake 901.34 ml  Output   1575 ml  Net -673.66 ml    PHYSICAL EXAMINATION: General: NAD Neuro: RASS 0. +/- F/C HEENT: Lt eye/scalp laceration healing Cardiovascular: Reg, no M Lungs: clear anteriorly Abdomen:  Soft, nontender, +BS Ext: symmetric mild edema   LABS: I have reviewed all of today's lab results. Relevant abnormalities are discussed in the A/P section  CXR: NNF  ASSESSMENT / PLAN:  PULMONARY ETT 8/18 >> 8/24 A: Acute respiratory failure 2nd to encephalopathy and inability to  protect airway. interstitial edema  P:   Monitor in ICU post ext Supp O2 to maintain SpO2 > 90% DNI re-confirmed with daughter 8/25 AM  CARDIOVASCULAR Rt femoral CVL 8/18 >>  A:  Hypovolemic/septic shock, resolved Bradycardia, resolved Hx of HTN. P:  Monitor hemodynamics Holding outpt norvasc, lasix, lopressor DNR  RENAL A:   AKI, non oliguric  CKD 9baseline Cr approx 5) Hypernatremia. NAG acidosis P:   Monitor BMET intermittently Monitor I/Os Correct electrolytes as indicated Not a candidate for HD  GASTROINTESTINAL A:   Elevated transaminases P:   SUP N/I post ext Advance diet as tolerated  HEMATOLOGIC A:   Anemia of chronic disease and critical illness. No obvious s/s bleeding  P:  DVT px: SCDs (SQ heparin DC'd 8/24) Monitor CBC intermittently Transfuse per usual guidelines  INFECTIOUS A:   Sepsis with UTI.  Hx of PCN allergy. P:   azactam started 8/18 >> 8/24 Urine 8/19>>>lactobacillus (unlikley pathogen) Blood 8/18 >> NEG  ENDOCRINE A:   Hx of DM type II with episodes of hypoglycemia. Hypothyroidism -- TSH 9.75 P:   Cont sens scale SSI Continue synthroid - note that dose has been increased this hospitalization as TSH was elevated   NEUROLOGIC A:   Acute metabolic encephalopathy, appears resolved Hx of advanced dementia. P:   RASS goal: 0 Resume Xanax and Elavil  Transfer to [facility] 8/25. TRH to assume care as of AM 8/26 and PCCM to sign off  Billy Fischer, MD ; [facility] [phone].  After 5:30 PM or weekends, call [phone]

## 2014-08-16 LAB — GLUCOSE, CAPILLARY
GLUCOSE-CAPILLARY: 75 mg/dL (ref 70–99)
GLUCOSE-CAPILLARY: 97 mg/dL (ref 70–99)
GLUCOSE-CAPILLARY: 78 mg/dL (ref 70–99)
Glucose-Capillary: 78 mg/dL (ref 70–99)

## 2014-08-16 MED ORDER — ENSURE PUDDING PO PUDG
1.0000 | Freq: Three times a day (TID) | ORAL | Status: DC
  Administered 2014-08-16 – 2014-08-19 (×6): 1 via ORAL

## 2014-08-16 NOTE — Progress Notes (Signed)
[place] TEAM 1 - Stepdown/ICU TEAM Progress Note  [name] ZOX:[id number] DOB: [birth year] DOA: 08/08/2014 PCP: [name], MD  Admit HPI / Brief Narrative: 68 yo female w/ hx of advanced dementia who was found unresponsive at home. In the ED she was noted to have hypotension, bradycardia, and hypothermia in the setting of UTI and hypothyroidism. She was intubated for airway protection, and PCCM admitted.  Significant Events:  8/18 Fall from toilet, brady on pacer pads, intubated in ED. Admitted to ICU  8/19 off pressors  8/20 brady improved, DNR established  8/21 one unit PRBCs transfused for Hgb 6.8  8/24 extubated  8/24 one unit PRBCs transfused for Hgb 6.8   HPI/Subjective: Pt is awake, but unable to provide a hx due to severe dementia.  She does not appear to be uncomfortable.    Assessment/Plan:  Sepsis with ? Lactobacillus UTI pt appears to be stabilizing clinically - unclear if this is simply contaminant or true infection - pt has completed ~7 days of tx at this time   Acute respiratory failure 2nd to encephalopathy and inability to protect airway Resolved - not to be re-intubated should this recur  Rt femoral CVL 8/18 >>  Will ask for PICC to be placed, and d/c femoral line   Hypovolemic / septic shock resolved   Bradycardia resolved - possible mediated by respiratory failure / hypoxia + BB - do not resume BB  Hx of HTN BP rebounding - cont norvasc and follow w/o chagne for today   AKI, non oliguric on CKD baseline Cr approx 5 - crt presently holding steady around 6  Hypernatremia Resolved   Elevated transaminases Likely due to hypoperfusion - recheck in AM   Anemia of chronic disease and critical illness No obvious s/s bleeding - has been transfused 2u PRBC - follow trend    Hx of DM type II with episodes of hypoglycemia CBG well controlled at this time   Hypothyroidism TSH 9.75 w/ low free T3 and free T4 - synthroid has been increased  - recheck in 6-8 weeks   Hx of advanced dementia  MRSA screen +  Code Status: NO CODE  Family Communication: no family present at time of exam Disposition Plan: transfer to med bed   Consultants: Palliative Care PCCM  Antibiotics: Aztreonam 8/18 > 8/24  DVT prophylaxis: SCDs  Objective: Blood pressure 175/73, pulse 76, temperature 97.9 F (36.6 C), temperature source Oral, resp. rate 17, height 5' 1.81" (1.57 m), weight 73.2 kg (161 lb 6 oz), SpO2 94.00%.  Intake/Output Summary (Last 24 hours) at 08/16/14 1155 Last data filed at 08/16/14 1000  Gross per 24 hour  Intake      0 ml  Output   1950 ml  Net  -1950 ml   Exam: General: No acute respiratory distress - demented - does not interact w/ examiner but is awake  Lungs: Clear to auscultation bilaterally without wheezes or crackles Cardiovascular: Regular rate and rhythm without murmur gallop or rub  Abdomen: Nontender, nondistended, soft, bowel sounds positive, no rebound, no ascites, no appreciable mass Extremities: No significant cyanosis, clubbing;  Trace edema bilateral lower extremities  Data Reviewed: Basic Metabolic Panel:  Recent Labs Lab 08/10/14 0430  08/11/14 0430 08/12/14 0308 08/13/14 0450 08/14/14 0445 08/15/14 0524  NA 160*  < > 151* 145 142 146 145  K 4.7  < > 4.7 4.3 3.7 3.8 3.9  CL >130*  < > 125* 114* 110 112 110  CO2 14*  < >  15* 17* 17* 18* 17*  GLUCOSE 98  < > 88 176* 173* 134* 119*  BUN 105*  < > 106* 107* 114* 116* 120*  CREATININE 6.11*  < > 5.92* 5.72* 5.70* 5.75* 5.79*  CALCIUM 8.4  < > 7.9* 7.4* 7.8* 7.5* 8.3*  MG 1.9  --   --   --   --   --   --   PHOS 5.1*  --   --   --   --   --   --   < > = values in this interval not displayed.  Liver Function Tests:  Recent Labs Lab 08/11/14 0430  AST 102*  ALT 210*  ALKPHOS 283*  BILITOT <0.2*  PROT 5.1*  ALBUMIN 1.9*   CBC:  Recent Labs Lab 08/11/14 1241 08/12/14 0308 08/13/14 0450 08/14/14 0445 08/15/14 0524  WBC  8.4 8.4 8.3 5.5 6.3  NEUTROABS  --  6.2  --   --   --   HGB 7.5* 6.8* 7.4* 6.8* 8.7*  HCT 23.8* 20.6* 22.5* 20.3* 25.2*  MCV 88.8 83.4 83.6 84.6 81.0  PLT 148* 147* 171 147* 144*    CBG:  Recent Labs Lab 08/15/14 1225 08/15/14 1612 08/15/14 1958 08/15/14 2252 08/16/14 0834  GLUCAP 94 105* 105* 86 75    Recent Results (from the past 240 hour(s))  CULTURE, BLOOD (ROUTINE X 2)     Status: None   Collection Time    08/08/14  9:46 PM      Result Value Ref Range Status   Specimen Description BLOOD RIGHT THUMB   Final   Special Requests BOTTLES DRAWN AEROBIC ONLY 3CC   Final   Culture  Setup Time     Final   Value: 08/09/2014 01:06     Performed at [facility]   Culture     Final   Value: NO GROWTH 5 DAYS     Performed at [facility]   Report Status 08/15/2014 FINAL   Final  URINE CULTURE     Status: None   Collection Time    08/08/14  9:48 PM      Result Value Ref Range Status   Specimen Description URINE, CATHETERIZED   Final   Special Requests NONE   Final   Culture  Setup Time     Final   Value: 08/09/2014 05:15     Performed at [facility] Count     Final   Value: >=100,000 COLONIES/ML     Performed at [facility]   Culture     Final   Value: LACTOBACILLUS SPECIES     Note: Standardized susceptibility testing for this organism is not available.     Performed at [facility]   Report Status 08/10/2014 FINAL   Final  CULTURE, BLOOD (ROUTINE X 2)     Status: None   Collection Time    08/08/14  9:53 PM      Result Value Ref Range Status   Specimen Description BLOOD LEFT HAND   Final   Special Requests BOTTLES DRAWN AEROBIC ONLY 4CC   Final   Culture  Setup Time     Final   Value: 08/09/2014 01:06     Performed at [facility]   Culture     Final   Value: NO GROWTH 5 DAYS     Performed at [facility]   Report Status 08/15/2014 FINAL   Final  MRSA  PCR SCREENING     Status: Abnormal    Collection Time    08/09/14  3:20 AM      Result Value Ref Range Status   MRSA by PCR POSITIVE (*) NEGATIVE Final   Comment:            The GeneXpert MRSA Assay (FDA     approved for NASAL specimens     only), is one component of a     comprehensive MRSA colonization     surveillance program. It is not     intended to diagnose MRSA     infection nor to guide or     monitor treatment for     MRSA infections.     RESULT CALLED TO, READ BACK BY AND VERIFIED WITH:     PARRISH,J RN [phone] AT [phone] SKEEN,P     Studies:  Recent x-ray studies have been reviewed in detail by the Attending Physician  Scheduled Meds:  Scheduled Meds: . amitriptyline  50 mg Oral QHS  . amLODipine  10 mg Oral Daily  . antiseptic oral rinse  7 mL Mouth Rinse BID  . insulin aspart  0-15 Units Subcutaneous TID WC  . insulin aspart  0-5 Units Subcutaneous QHS  . levothyroxine  50 mcg Oral QAC breakfast    Time spent on care of this patient: 35 mins   Dreydon Cardenas T , MD   [facility] Office  [phone] Pager - Text Page per Loretha Stapler as per below:  On-Call/Text Page:      [email or web address]      password [email or web address]  If 7PM-7AM, please contact night-coverage [email or web address] Password [facility] 08/16/2014, 11:55 AM   LOS: 8 days

## 2014-08-16 NOTE — Evaluation (Signed)
Clinical/Bedside Swallow Evaluation Patient Details  Name: [name] MRN: [id number] Date of Birth: October 22, 1946  Today's Date: 08/16/2014 Time: 1112-1124 SLP Time Calculation (min): 12 min  Past Medical History:  Past Medical History  Diagnosis Date  . Hypertension   . DEMENTIA   . Diabetes mellitus   . Arthritis   . Hypothyroidism    Past Surgical History:  Past Surgical History  Procedure Laterality Date  . Toe amputation      2 toes  . Ankle fracture surgery    . Clavicle surgery    . Abdominal hysterectomy    . Fracture surgery    . Cholecystectomy    . I&d extremity  05/14/2012    Procedure: IRRIGATION AND DEBRIDEMENT EXTREMITY;  Surgeon: Erika Shores, MD;  Location: [facility] OR;  Service: Orthopedics;  Laterality: Left;  . Orif finger fracture  05/14/2012    Procedure: OPEN REDUCTION INTERNAL FIXATION (ORIF) METACARPAL (FINGER) FRACTURE;  Surgeon: Erika Shores, MD;  Location: [facility] OR;  Service: Orthopedics;  Laterality: Left;   HPI:  68 yo female found unresponsive at home. In ED noted to have hypotension, bradycardia, hypothermia likely in setting of UTI and hypothyroidism. She was intubated 8/18 for airway protection- extubated 8/24. She has hx of advanced dementia.  Being followed by Palliative Medicine.  Is full DNR with no reintubation.     Assessment / Plan / Recommendation Clinical Impression  Pt presents with a primary oral dysphagia c/w dementia-based behaviors, including intermittent oral retention of POs, reduced oral manipulation and prolonged oral phase.  There were no overt s/s of aspiration noted.  Phonation was clear and RR was compatible with proper swallow sequencing.  Pt had difficulty following commands and self-feeding.  She will require full hand-over-hand assist with feeding to ensure safe pacing and food quantities.      Aspiration Risk  Mild    Diet Recommendation Dysphagia 1 (Puree);Thin liquid   Liquid Administration via:  Cup Medication Administration: Whole meds with puree Supervision: Staff to assist with self feeding;Full supervision/cueing for compensatory strategies Compensations: Slow rate;Small sips/bites;Check for pocketing Postural Changes and/or Swallow Maneuvers: Seated upright 90 degrees    Other  Recommendations Oral Care Recommendations: Oral care BID Other Recommendations: Order thickener from pharmacy   Follow Up Recommendations   (tba)    Frequency and Duration min 2x/week  1 week       SLP Swallow Goals     Swallow Study Prior Functional Status       General Date of Onset: 08/08/14 HPI: 68 yo female found unresponsive at home. In ED noted to have hypotension, bradycardia, hypothermia likely in setting of UTI and hypothyroidism. She was intubated 8/18 for airway protection- extubated 8/24. She has hx of advanced dementia.  Being followed by Palliative Medicine.  Is full DNR with no reintubation.   Type of Study: Bedside swallow evaluation Previous Swallow Assessment: none per records Diet Prior to this Study: NPO Temperature Spikes Noted: No Respiratory Status: Nasal cannula (6 liters - 94%) History of Recent Intubation: Yes Length of Intubations (days): 6 days Date extubated: 08/14/14 Behavior/Cognition: Alert;Confused;Impulsive;Requires cueing Oral Cavity - Dentition: Missing dentition Self-Feeding Abilities: Total assist Patient Positioning: Upright in bed Baseline Vocal Quality: Clear Volitional Cough: Cognitively unable to elicit Volitional Swallow: Unable to elicit    Oral/Motor/Sensory Function Overall Oral Motor/Sensory Function: Appears within functional limits for tasks assessed (no focal deficits)   Ice Chips Ice chips: Impaired Presentation: Spoon Oral Phase Impairments: Reduced labial seal;Reduced  lingual movement/coordination;Impaired anterior to posterior transit Oral Phase Functional Implications: Right anterior spillage;Left anterior spillage   Thin  Liquid Thin Liquid: Impaired Presentation: Cup;Spoon Oral Phase Impairments: Reduced labial seal;Reduced lingual movement/coordination Oral Phase Functional Implications: Right anterior spillage;Left anterior spillage    Nectar Thick Nectar Thick Liquid: Not tested   Honey Thick Honey Thick Liquid: Not tested   Puree Puree: Impaired Oral Phase Impairments: Reduced labial seal;Reduced lingual movement/coordination   Solid  [name] L. [place], [place] CCC/SLP Pager [phone]     Solid: Not tested       [name] 08/16/2014,11:58 AM

## 2014-08-16 NOTE — Progress Notes (Signed)
NUTRITION FOLLOW UP  Intervention:    Ensure Pudding PO TID, each supplement provides 170 kcal and 4 grams of protein  Nutrition Dx:   Inadequate oral intake related to recent intubation as evidenced by prolonged NPO status, diet just advanced; ongoing.  Goal:   Provide PO diet to meet nutrition needs as able.  Monitor:   PO intake, labs, weight trend.  Assessment:   68 year old Female with DM and dementia found down at home. Hypoglycemic and bradycardic in field, transcutaneously paced. Intubated in ED for airway protection.  Patient was extubated on 8/24. S/P swallow evaluation with SLP this morning. Diet has just been advanced to dysphagia 1 with thin liquids. Per discussion with RN, plans for discharge to home with Hospice.   Height: Ht Readings from Last 1 Encounters:  08/08/14 5' 1.81" (1.57 m)    Weight Status:   Wt Readings from Last 1 Encounters:  08/16/14 161 lb 6 oz (73.2 kg)    Re-estimated needs:  Kcal: 1450-1650 Protein: 80-90 gm Fluid: >/= 1.5 L  Skin: left eye laceration  Diet Order: Dysphagia 1 with thin liquids   Intake/Output Summary (Last 24 hours) at 08/16/14 1203 Last data filed at 08/16/14 1000  Gross per 24 hour  Intake      0 ml  Output   1750 ml  Net  -1750 ml    Last BM: 8/26   Labs:   Recent Labs Lab 08/10/14 0430  08/13/14 0450 08/14/14 0445 08/15/14 0524  NA 160*  < > 142 146 145  K 4.7  < > 3.7 3.8 3.9  CL >130*  < > 110 112 110  CO2 14*  < > 17* 18* 17*  BUN 105*  < > 114* 116* 120*  CREATININE 6.11*  < > 5.70* 5.75* 5.79*  CALCIUM 8.4  < > 7.8* 7.5* 8.3*  MG 1.9  --   --   --   --   PHOS 5.1*  --   --   --   --   GLUCOSE 98  < > 173* 134* 119*  < > = values in this interval not displayed.  CBG (last 3)   Recent Labs  08/15/14 1958 08/15/14 2252 08/16/14 0834  GLUCAP 105* 86 75    Scheduled Meds: . amitriptyline  50 mg Oral QHS  . amLODipine  10 mg Oral Daily  . antiseptic oral rinse  7 mL Mouth Rinse  BID  . insulin aspart  0-15 Units Subcutaneous TID WC  . insulin aspart  0-5 Units Subcutaneous QHS  . levothyroxine  50 mcg Oral QAC breakfast    Continuous Infusions: None  Joaquin Courts, RD, LDN, CNSC Pager [phone] After Hours Pager [phone]

## 2014-08-16 NOTE — Progress Notes (Signed)
Peripherally Inserted Central Catheter/Midline Placement  The IV Nurse has discussed with the patient and/or persons authorized to consent for the patient, the purpose of this procedure and the potential benefits and risks involved with this procedure.  The benefits include less needle sticks, lab draws from the catheter and patient may be discharged home with the catheter.  Risks include, but not limited to, infection, bleeding, blood clot (thrombus formation), and puncture of an artery; nerve damage and irregular heat beat.  Alternatives to this procedure were also discussed.  PICC/Midline Placement Documentation  PICC / Midline Single Lumen 08/16/14 PICC Left Brachial 46 cm 0 cm (Active)  Indication for Insertion or Continuance of Line Home intravenous therapies (PICC only) 08/16/2014  3:00 PM  Exposed Catheter (cm) 0 cm 08/16/2014  3:00 PM  Dressing Change Due 08/23/14 08/16/2014  3:00 PM    Telephone consent   Stacie Glaze Horton 08/16/2014, 3:32 PM

## 2014-08-17 DIAGNOSIS — D638 Anemia in other chronic diseases classified elsewhere: Secondary | ICD-10-CM

## 2014-08-17 DIAGNOSIS — A419 Sepsis, unspecified organism: Secondary | ICD-10-CM

## 2014-08-17 DIAGNOSIS — N179 Acute kidney failure, unspecified: Secondary | ICD-10-CM

## 2014-08-17 DIAGNOSIS — E870 Hyperosmolality and hypernatremia: Secondary | ICD-10-CM

## 2014-08-17 LAB — CBC
HCT: 24.6 % — ABNORMAL LOW (ref 36.0–46.0)
HEMOGLOBIN: 8.3 g/dL — AB (ref 12.0–15.0)
MCH: 29.1 pg (ref 26.0–34.0)
MCHC: 33.7 g/dL (ref 30.0–36.0)
MCV: 86.3 fL (ref 78.0–100.0)
Platelets: 211 10*3/uL (ref 150–400)
RBC: 2.85 MIL/uL — ABNORMAL LOW (ref 3.87–5.11)
RDW: 17.5 % — AB (ref 11.5–15.5)
WBC: 7.5 10*3/uL (ref 4.0–10.5)

## 2014-08-17 LAB — COMPREHENSIVE METABOLIC PANEL
ALBUMIN: 1.9 g/dL — AB (ref 3.5–5.2)
ALK PHOS: 182 U/L — AB (ref 39–117)
ALT: 53 U/L — ABNORMAL HIGH (ref 0–35)
AST: 32 U/L (ref 0–37)
Anion gap: 18 — ABNORMAL HIGH (ref 5–15)
BUN: 113 mg/dL — ABNORMAL HIGH (ref 6–23)
CO2: 18 mEq/L — ABNORMAL LOW (ref 19–32)
Calcium: 7.8 mg/dL — ABNORMAL LOW (ref 8.4–10.5)
Chloride: 114 mEq/L — ABNORMAL HIGH (ref 96–112)
Creatinine, Ser: 5.83 mg/dL — ABNORMAL HIGH (ref 0.50–1.10)
GFR calc Af Amer: 8 mL/min — ABNORMAL LOW (ref >90)
GFR calc non Af Amer: 7 mL/min — ABNORMAL LOW (ref >90)
GLUCOSE: 78 mg/dL (ref 70–99)
POTASSIUM: 3.9 meq/L (ref 3.7–5.3)
Sodium: 150 mEq/L — ABNORMAL HIGH (ref 137–147)
TOTAL PROTEIN: 5.3 g/dL — AB (ref 6.0–8.3)
Total Bilirubin: 0.3 mg/dL (ref 0.3–1.2)

## 2014-08-17 LAB — GLUCOSE, CAPILLARY
GLUCOSE-CAPILLARY: 77 mg/dL (ref 70–99)
GLUCOSE-CAPILLARY: 82 mg/dL (ref 70–99)
GLUCOSE-CAPILLARY: 94 mg/dL (ref 70–99)
Glucose-Capillary: 117 mg/dL — ABNORMAL HIGH (ref 70–99)

## 2014-08-17 MED ORDER — DEXTROSE 5 % IV SOLN
INTRAVENOUS | Status: DC
  Administered 2014-08-17 – 2014-08-19 (×3): via INTRAVENOUS

## 2014-08-17 MED ORDER — LEVOTHYROXINE SODIUM 100 MCG IV SOLR
25.0000 ug | Freq: Every day | INTRAVENOUS | Status: DC
  Administered 2014-08-17 – 2014-08-19 (×3): 25 ug via INTRAVENOUS
  Filled 2014-08-17 (×4): qty 5

## 2014-08-17 NOTE — Progress Notes (Signed)
Pt transferred to 6 north room 27. Report called to [employer], All belonging sent with patient. Family notified. Elink notified.

## 2014-08-17 NOTE — Progress Notes (Signed)
  Echocardiogram 2D Echocardiogram has been performed.  [name] [name] 08/17/2014, 3:12 PM

## 2014-08-17 NOTE — Progress Notes (Signed)
Speech Language Pathology Treatment: Dysphagia  Patient Details Name: [name] MRN: [id number] DOB: Sep 27, 1946 Today's Date: 08/17/2014 Time: 4098-1191 SLP Time Calculation (min): 13 min  Assessment / Plan / Recommendation Clinical Impression  Diagnostic treatment complete to assess for diet tolerance and determine potential for diet upgrade. Patient initially lethargic, aroused with max tactile, verbal, and environmental adjustments. Patient orally accepted bolus with minimal tactile cueing for intake of both liquids and solids provided by SLP. Patient with minimal baseline barking cough with increased in nature during po intake. Swallow appearing timely with solids, suspect delayed swallow initiation with thin liquids due to audible swallow. Now on venti-mask for O2. Increased respiratory needs combined with baseline dementia do increase aspiration risk with pos. Overall, todays coughing appeared to be baseline rather than due to aspiration however will continue to f/u closely. No further pos trialed due to general lethargy.     HPI HPI: 68 yo female found unresponsive at home. In ED noted to have hypotension, bradycardia, hypothermia likely in setting of UTI and hypothyroidism. She was intubated 8/18 for airway protection- extubated 8/24. She has hx of advanced dementia.  Being followed by Palliative Medicine.  Is full DNR with no reintubation.     Pertinent Vitals Pain Assessment: Faces Faces Pain Scale: No hurt  SLP Plan  Continue with current plan of care    Recommendations Diet recommendations: Dysphagia 1 (puree);Thin liquid Liquids provided via: Cup;Straw Medication Administration: Whole meds with puree Supervision: Staff to assist with self feeding;Full supervision/cueing for compensatory strategies Compensations: Slow rate;Small sips/bites;Check for pocketing Postural Changes and/or Swallow Maneuvers: Seated upright 90 degrees              Oral Care Recommendations:  Oral care BID Follow up Recommendations:  (tba) Plan: Continue with current plan of care    GO   Erika Lango MA, CCC-SLP [phone]   [name] [name] 08/17/2014, 10:41 AM

## 2014-08-17 NOTE — Progress Notes (Signed)
[place] TEAM 1 - Stepdown/ICU TEAM Progress Note  Tayvia Faughnan Guo UEA:[id number] DOB: 1946/02/03 DOA: 08/08/2014 PCP: [name], MD  Admit HPI / Brief Narrative: 68 yo BF PMHx Advanced Dementia, hypothyroidism, diabetes type 2 with renal manifestations controlled, HTN. She requires assistance for all ADLs including walking, and is frequently confused. Family reports 8/15 she had some slurred and slowing of speech which resolved spontaneously. She was in her USOH after that. Then 8/18 she was walked to the toilet and left there for short period when family heard a thud. They entered bathroom to find her on floor with laceration above L eye. She was unresponsive. In the ED she was noted to have hypotension, bradycardia, and hypothermia in the setting of UTI and hypothyroidism. She was intubated for airway protection, and PCCM admitted.      HPI/Subjective: 8/27 A/O. x0, will open eyes to painful stimulation and moans.  Assessment/Plan: Sepsis with ? Lactobacillus UTI  -pt appears to be stabilizing clinically, unclear if this is simply contaminant or true infection - pt has completed ~7 days of tx at this time   Acute respiratory failure 2nd to encephalopathy and inability to protect airway  -Continue to require O2 support via mask to maintain sats.   Hypovolemic / septic shock  -resolved   Hypernatremia  -Start D5W 50 ml/hr   Bradycardia  -resolved, however with syncopal episode will obtain echocardiogram - possible mediated by respiratory failure / hypoxia + BB - do not resume BB   Hx of HTN  -cont norvasc 10 mg daily  -Given patient's mental status, and age will (patient appears much older than stated age of 67) will allow permissive HTN   AKI, non oliguric on CKD  -baseline Cr approx 5 ,  presently holding steady around 6   Elevated transaminases  -Likely due to hypoperfusion (shock liver), continue to trend   Anemia of chronic disease and critical illness  -No  obvious s/s bleeding - has been transfused 2u PRBC - follow trend   Hx of DM type II with episodes of hypoglycemia  -CBG well controlled at this time   Hypothyroidism  -TSH 9.75 w/ low free T3 and free T4  - synthroid has been increased,recheck in 6-8 weeks   Hx of advanced dementia  -Palliative care on board per note from 8/25 family meeting planned, awaiting their recommendations as to short-term/long-term plan of care -Speech evaluation; recommends Dysphagia 1 (Puree);Thin liquid    MRSA screen +    Code Status: FULL Family Communication: no family present at time of exam Disposition Plan: SNF vs home health    Consultants: Dr. Billy Fischer [facility].)    Procedure/Significant Events: 8/18 Fall from toilet, brady on pacer pads, intubated in ED. Admitted to ICU  8/19 off pressors  8/20 brady improved, DNR established  8/21 one unit PRBCs transfused for Hgb 6.8  8/24 extubated  8/24 one unit PRBCs transfused for Hgb 6.8  8/26 single lumen midline PICC placed   Culture 8/18 blood right thumb/left hand, negative 8/18 urine lactobacillus 8/19 MRSA by PCR positive   Antibiotics: Aztreonam 8/18>> stopped 8/24  DVT prophylaxis: SCD   Devices    LINES / TUBES:  8/26 single lumen midline PICC placed    Continuous Infusions:   Objective: VITAL SIGNS: Temp: 98.1 F (36.7 C) (08/27 0942) Temp src: Oral (08/27 0942) BP: 148/54 mmHg (08/27 0942) Pulse Rate: 82 (08/27 0942) SPO2; 94% on mask 6 L O2 per minute FIO2:  Intake/Output Summary (Last 24 hours) at 08/17/14 1330 Last data filed at 08/17/14 0800  Gross per 24 hour  Intake      0 ml  Output   1875 ml  Net  -1875 ml     Exam: General: A./O. x0, does not cooperate with exam, the only way to painful stimuli, mumbles incoherently, No acute respiratory distress Lungs: Clear to auscultation bilaterally without wheezes or crackles Cardiovascular: Regular rate and rhythm without murmur gallop or  rub normal S1 and S2 Abdomen: Nontender, nondistended, soft, absent bowel sounds, no rebound, no ascites, no appreciable mass Extremities: No significant cyanosis, clubbing, or edema bilateral lower extremities   Data Reviewed: Basic Metabolic Panel:  Recent Labs Lab 08/12/14 0308 08/13/14 0450 08/14/14 0445 08/15/14 0524 08/17/14 0235  NA 145 142 146 145 150*  K 4.3 3.7 3.8 3.9 3.9  CL 114* 110 112 110 114*  CO2 17* 17* 18* 17* 18*  GLUCOSE 176* 173* 134* 119* 78  BUN 107* 114* 116* 120* 113*  CREATININE 5.72* 5.70* 5.75* 5.79* 5.83*  CALCIUM 7.4* 7.8* 7.5* 8.3* 7.8*   Liver Function Tests:  Recent Labs Lab 08/11/14 0430 08/17/14 0235  AST 102* 32  ALT 210* 53*  ALKPHOS 283* 182*  BILITOT <0.2* 0.3  PROT 5.1* 5.3*  ALBUMIN 1.9* 1.9*   No results found for this basename: LIPASE, AMYLASE,  in the last 168 hours No results found for this basename: AMMONIA,  in the last 168 hours CBC:  Recent Labs Lab 08/12/14 0308 08/13/14 0450 08/14/14 0445 08/15/14 0524 08/17/14 0235  WBC 8.4 8.3 5.5 6.3 7.5  NEUTROABS 6.2  --   --   --   --   HGB 6.8* 7.4* 6.8* 8.7* 8.3*  HCT 20.6* 22.5* 20.3* 25.2* 24.6*  MCV 83.4 83.6 84.6 81.0 86.3  PLT 147* 171 147* 144* 211   Cardiac Enzymes: No results found for this basename: CKTOTAL, CKMB, CKMBINDEX, TROPONINI,  in the last 168 hours BNP (last 3 results) No results found for this basename: PROBNP,  in the last 8760 hours CBG:  Recent Labs Lab 08/16/14 1204 08/16/14 1605 08/16/14 2222 08/17/14 0827 08/17/14 1222  GLUCAP 97 78 78 77 82    Recent Results (from the past 240 hour(s))  CULTURE, BLOOD (ROUTINE X 2)     Status: None   Collection Time    08/08/14  9:46 PM      Result Value Ref Range Status   Specimen Description BLOOD RIGHT THUMB   Final   Special Requests BOTTLES DRAWN AEROBIC ONLY 3CC   Final   Culture  Setup Time     Final   Value: 08/09/2014 01:06     Performed at [facility]   Culture      Final   Value: NO GROWTH 5 DAYS     Performed at [facility]   Report Status 08/15/2014 FINAL   Final  URINE CULTURE     Status: None   Collection Time    08/08/14  9:48 PM      Result Value Ref Range Status   Specimen Description URINE, CATHETERIZED   Final   Special Requests NONE   Final   Culture  Setup Time     Final   Value: 08/09/2014 05:15     Performed at [facility] Count     Final   Value: >=100,000 COLONIES/ML     Performed at [facility]  Culture     Final   Value: LACTOBACILLUS SPECIES     Note: Standardized susceptibility testing for this organism is not available.     Performed at [facility]   Report Status 08/10/2014 FINAL   Final  CULTURE, BLOOD (ROUTINE X 2)     Status: None   Collection Time    08/08/14  9:53 PM      Result Value Ref Range Status   Specimen Description BLOOD LEFT HAND   Final   Special Requests BOTTLES DRAWN AEROBIC ONLY 4CC   Final   Culture  Setup Time     Final   Value: 08/09/2014 01:06     Performed at [facility]   Culture     Final   Value: NO GROWTH 5 DAYS     Performed at [facility]   Report Status 08/15/2014 FINAL   Final  MRSA PCR SCREENING     Status: Abnormal   Collection Time    08/09/14  3:20 AM      Result Value Ref Range Status   MRSA by PCR POSITIVE (*) NEGATIVE Final   Comment:            The GeneXpert MRSA Assay (FDA     approved for NASAL specimens     only), is one component of a     comprehensive MRSA colonization     surveillance program. It is not     intended to diagnose MRSA     infection nor to guide or     monitor treatment for     MRSA infections.     RESULT CALLED TO, READ BACK BY AND VERIFIED WITH:     PARRISH,J RN [phone] AT [phone] SKEEN,P     Studies:  Recent x-ray studies have been reviewed in detail by the Attending Physician  Scheduled Meds:  Scheduled Meds: . amitriptyline  50 mg Oral QHS  . amLODipine  10 mg Oral Daily    . feeding supplement (ENSURE)  1 Container Oral TID BM  . insulin aspart  0-15 Units Subcutaneous TID WC  . insulin aspart  0-5 Units Subcutaneous QHS  . levothyroxine  25 mcg Intravenous Daily    Time spent on care of this patient: 40 mins   Drema Dallas , MD   [facility] Office  [phone] Pager [phone]  On-Call/Text Page:      [email or web address]      password [email or web address]  If 7PM-7AM, please contact night-coverage [email or web address] Password [email or web address] 08/17/2014, 1:30 PM   LOS: 9 days

## 2014-08-18 LAB — COMPREHENSIVE METABOLIC PANEL
ALBUMIN: 1.8 g/dL — AB (ref 3.5–5.2)
ALK PHOS: 169 U/L — AB (ref 39–117)
ALT: 42 U/L — AB (ref 0–35)
AST: 24 U/L (ref 0–37)
Anion gap: 15 (ref 5–15)
BUN: 103 mg/dL — ABNORMAL HIGH (ref 6–23)
CALCIUM: 8 mg/dL — AB (ref 8.4–10.5)
CO2: 18 mEq/L — ABNORMAL LOW (ref 19–32)
Chloride: 115 mEq/L — ABNORMAL HIGH (ref 96–112)
Creatinine, Ser: 5.74 mg/dL — ABNORMAL HIGH (ref 0.50–1.10)
GFR calc non Af Amer: 7 mL/min — ABNORMAL LOW (ref >90)
GFR, EST AFRICAN AMERICAN: 8 mL/min — AB (ref >90)
Glucose, Bld: 117 mg/dL — ABNORMAL HIGH (ref 70–99)
POTASSIUM: 3.6 meq/L — AB (ref 3.7–5.3)
SODIUM: 148 meq/L — AB (ref 137–147)
TOTAL PROTEIN: 5.6 g/dL — AB (ref 6.0–8.3)
Total Bilirubin: 0.2 mg/dL — ABNORMAL LOW (ref 0.3–1.2)

## 2014-08-18 LAB — CBC
HEMATOCRIT: 24.8 % — AB (ref 36.0–46.0)
Hemoglobin: 8.2 g/dL — ABNORMAL LOW (ref 12.0–15.0)
MCH: 28.7 pg (ref 26.0–34.0)
MCHC: 33.1 g/dL (ref 30.0–36.0)
MCV: 86.7 fL (ref 78.0–100.0)
Platelets: 199 10*3/uL (ref 150–400)
RBC: 2.86 MIL/uL — ABNORMAL LOW (ref 3.87–5.11)
RDW: 17.6 % — ABNORMAL HIGH (ref 11.5–15.5)
WBC: 8.6 10*3/uL (ref 4.0–10.5)

## 2014-08-18 LAB — GLUCOSE, CAPILLARY
GLUCOSE-CAPILLARY: 104 mg/dL — AB (ref 70–99)
GLUCOSE-CAPILLARY: 137 mg/dL — AB (ref 70–99)
GLUCOSE-CAPILLARY: 144 mg/dL — AB (ref 70–99)
Glucose-Capillary: 106 mg/dL — ABNORMAL HIGH (ref 70–99)

## 2014-08-18 LAB — MAGNESIUM: MAGNESIUM: 1.7 mg/dL (ref 1.5–2.5)

## 2014-08-18 NOTE — Progress Notes (Signed)
Patient ZO:[id number] A Parmer      DOB: 1946-08-22      VWU:[id number]  Daughter [name] called back. She reports that she wants to take her mother home and is open to hospice care at home.  Updated CM to offer choice.  Daughter and I again spoke about mom's inablity to maintain nutrition and hydration. [name] knows this is part of her dementia. She dose not want to seek feeding tube placment.  [name] has been at work and unable to return during the day.  I would ask the Hospice team to review a MOST for with her at home.  PMT will shadow to Discharge.    Jewel Mcafee L. Ladona Ridgel, MD MBA The Palliative Medicine Team at [facility] Phone: [phone] Pager: [phone] ( Use team phone after hours)

## 2014-08-18 NOTE — Progress Notes (Addendum)
Notified by [facility], patient and family request services of Hospcie and Palliative Care of [facility]) after discharge.  Patient information reviewed with Dr Elliot Gurney, [facility] Medical Director and eligibility confirmed. Patient seen at bedside, incontinent small amount stool and resistant to staff attempting personal care. Noted breakfast tray at bedside, per aide, pt, when fed, had taken some pudding, a few bites of egg. No family at bedside. Called daughter [name] and left voice message, after no return call within 45 minutes called again and [name] picked up. She is currently at work and could not be on the phone long- she is aware of plans for discharge home tomorrow and wants [facility] to follow.   Per notes and discussion with [name] plan is to d/c by non-emergent transport tomorrow, Saturday 08/19/14.  [name] requests Foley catheter be left in at discharge for comfort, given her mother at this time is more weak and bedbound.  Writer spoke with daughter [name] regarding choice of attending physician working with Hospice once patient returns home; she informs Dr Algie Coffer is pt's PCP and pt saw him last month however [facility] was made aware Dr Algie Coffer out of town until next Friday and will not agree to be attending; given this situation daughter is agreeable to [facility] contacting Marletta Lor NP with [facility] being contact to see if she will attend once pt is home.   *Please send completed GOLD DNR form home with patient.  DME needs: Complete Pkg D: fully electric hospital bed with full rails, AP&P mattress, overbed table and Oxygen Pkg D: pt currently on O2 simple mask @ 6L, also receiving PRN Nebulizer Tx and Neb machine will be ordered Chartered loss adjuster spoke with Loistine Simas [facility] who will contact [facility] and request DME as noted to be delivered tonight *NOTE: Pt to d/c to daughter's home: [address] [place] [address] Please contact  daughter [name] c: [phone] to arrange delivery of equipment  Initial paperwork faxed to [facility]. * Completed d/c summary will need to be faxed to [facility] @ [phone] when final Please notify [facility] when patient is ready to leave unit at d/c call [phone]. [facility] information and contact numbers left at bedside for daughter should she come to hospital this evening. She is aware [facility] admission nurse to see patient after she gets home tomorrow.   Above information shared with [facility] Please call with any questions or concerns   Valente David, RN 08/18/2014, 1:17 PM [facility] Liaison [phone]

## 2014-08-18 NOTE — Progress Notes (Signed)
[facility]                                                                              Patient Demographics  [name], is a 68 y.o. female, DOB - [birth year], MWU:[id number]  Admit date - 08/08/2014   Admitting Physician Merwyn Katos, MD  Outpatient Primary MD for the patient is [name], MD  LOS - 10   Chief Complaint  Patient presents with  . Bradycardia  . Hypoglycemia      HPI on 08/08/2014 68 year old female with PMH of hypertension, diabetes, dementia.  Upon admission, the family stated that patient was at her baseline and has advanced dementia. She requires assistance for all ADLs including walking, and is frequently confused. Family reported 8/15 she had some slurred and slowing of speech which resolved spontaneously. She was in her usual state of health after that. Then 8/18 she was walked to the toilet and left there for short period when family heard a thud. They entered bathroom to find her on floor with laceration above L eye. She was unresponsive. EMS was called and at their arrival she was found to be hypotensive and bradycardic with pulse in the 40s. She was given lidocaine, placed on transcutaneous pacer, and brought to the ED where she was intubated for airway protection. PCCM asked to see for admission.    Assessment & Plan   Sepsis with ? Lactobacillus UTI  -Appears to be stable -unclear if this is simply contaminant or true infection  -Completed antibiotic course  Acute respiratory failure 2nd to encephalopathy and inability to protect airway  -Appears to be stable -Currently DNR -Required intubation upon admission for airway protection -Continue oxygen to maintain sats >92%  Hypovolemic / septic shock  -resolved   Hypernatremia  -Continue IVF  Bradycardia  -resolved, however with syncopal episode, echo was obtained -Possible mediated by respiratory failure with hypoxia, complicated by beta blockers -Echocardiogram: EF of  50-55%, grade 1 diastolic dysfunction  Hypertension -continue norvasc  AKI, non oliguric on CKD  -baseline Cr approx 5, appears to be stable, 5.74  Elevated transaminases  -Likely due to hypoperfusion (shock liver) -Improving, LFTs trending downward  Anemia of chronic disease and critical illness  -No obvious s/s bleeding  -Patient received blood transfusions during hospital course -Hemoglobin currently stable, 8.2  History of diabetes mellitus type II with episodes of hypoglycemia  -CBG well controlled at this time   Hypothyroidism  -TSH 9.75 w/ low free T3 and free T4  -synthroid has been increased, recheck in 6-8 weeks   Advanced dementia  -Palliative care consult is appreciated -Speech evaluation; recommends Dysphagia 1 (Puree);Thin liquid  -Dr. Ladona Ridgel spoke with patient's daughter, [name], who is open to hospice care -Case management made aware  Code Status: DO NOT RESUSCITATE  Family Communication: None at bedside  Disposition Plan: Admitted, likely discharge to home with hospice on 08/19/2014  Time Spent in minutes   30 minutes  Procedures/Significant Events 8/18 Fall from toilet, brady on pacer pads, intubated in ED. Admitted to ICU  8/19 off pressors  8/20 brady improved, DNR established  8/21 one unit PRBCs transfused for Hgb 6.8  8/24 extubated  8/24 one unit PRBCs transfused for Hgb 6.8  8/26 single lumen midline PICC placed  Consults   PCCM Palliative care  DVT Prophylaxis  SCDs  Lab Results  Component Value Date   PLT 199 08/18/2014    Medications  Scheduled Meds: . amitriptyline  50 mg Oral QHS  . amLODipine  10 mg Oral Daily  . feeding supplement (ENSURE)  1 Container Oral TID BM  . insulin aspart  0-15 Units Subcutaneous TID WC  . insulin aspart  0-5 Units Subcutaneous QHS  . levothyroxine  25 mcg Intravenous Daily   Continuous Infusions: . dextrose 75 mL/hr at 08/17/14 1337   PRN Meds:.ALPRAZolam, fentaNYL,  ipratropium-albuterol  Antibiotics    Anti-infectives   Start     Dose/Rate Route Frequency Ordered Stop   08/11/14 1000  metroNIDAZOLE (FLAGYL) tablet 500 mg  Status:  Discontinued     500 mg Oral Every 12 hours 08/11/14 0959 08/14/14 0926   08/10/14 0600  vancomycin (VANCOCIN) IVPB 1000 mg/200 mL premix  Status:  Discontinued     1,000 mg 200 mL/hr over 60 Minutes Intravenous Every 48 hours 08/10/14 0534 08/11/14 0959   08/09/14 0800  aztreonam (AZACTAM) 500 mg in dextrose 5 % 50 mL IVPB     500 mg 100 mL/hr over 30 Minutes Intravenous 3 times per day 08/08/14 2344 08/14/14 2135   08/08/14 2230  aztreonam (AZACTAM) 1 g in dextrose 5 % 50 mL IVPB     1 g 100 mL/hr over 30 Minutes Intravenous  Once 08/08/14 2228 08/16/14 1119   08/08/14 2215  metroNIDAZOLE (FLAGYL) IVPB 500 mg     500 mg 100 mL/hr over 60 Minutes Intravenous  Once 08/08/14 2212 08/09/14 0115   08/08/14 2215  vancomycin (VANCOCIN) 1,250 mg in sodium chloride 0.9 % 250 mL IVPB     1,250 mg 166.7 mL/hr over 90 Minutes Intravenous  Once 08/08/14 2212 08/09/14 0115      Subjective:   Alisia Meikle seen and examined today.  Patient currently not communicative, does open her eyes and moans. Does not follow commands.  Objective:   Filed Vitals:   08/17/14 1635 08/17/14 2139 08/18/14 0548 08/18/14 1318  BP: 159/56 152/60 146/66 145/72  Pulse: 72 76 72 76  Temp: 97.9 F (36.6 C) 98.1 F (36.7 C) 98 F (36.7 C) 98.4 F (36.9 C)  TempSrc:  Axillary Axillary Axillary  Resp: Height:      Weight:      SpO2: 98% 97% 98% 98%    Wt Readings from Last 3 Encounters:  08/16/14 73.2 kg (161 lb 6 oz)  01/12/14 83.915 kg (185 lb)     Intake/Output Summary (Last 24 hours) at 08/18/14 1341 Last data filed at 08/18/14 1330  Gross per 24 hour  Intake  217.5 ml  Output   1601 ml  Net -1383.5 ml    Exam  General: Well developed, well nourished, NAD, appears stated age  HEENT: Lambert, laceration above  the left eye, mucous membranes moist.   Cardiovascular: S1 S2 auscultated, no rubs, murmurs or gallops. Regular rate and rhythm.  Respiratory: Clear to auscultation bilaterally with equal chest rise  Abdomen: Soft, nondistended  Extremities: warm dry without cyanosis clubbing or edema  Neuro: unable to assess  Data Review   Micro Results Recent Results (from the past 240 hour(s))  CULTURE, BLOOD (ROUTINE X 2)     Status: None   Collection Time    08/08/14  9:46 PM      Result Value Ref Range Status   Specimen Description BLOOD RIGHT THUMB   Final   Special Requests BOTTLES DRAWN AEROBIC ONLY 3CC   Final   Culture  Setup Time     Final   Value: 08/09/2014 01:06     Performed at [facility]   Culture     Final   Value: NO GROWTH 5 DAYS     Performed at [facility]   Report Status 08/15/2014 FINAL   Final  URINE CULTURE     Status: None   Collection Time    08/08/14  9:48 PM      Result Value Ref Range Status   Specimen Description URINE, CATHETERIZED   Final   Special Requests NONE   Final   Culture  Setup Time     Final   Value: 08/09/2014 05:15     Performed at [facility] Count     Final   Value: >=100,000 COLONIES/ML     Performed at [facility]   Culture     Final   Value: LACTOBACILLUS SPECIES     Note: Standardized susceptibility testing for this organism is not available.     Performed at [facility]   Report Status 08/10/2014 FINAL   Final  CULTURE, BLOOD (ROUTINE X 2)     Status: None   Collection Time    08/08/14  9:53 PM      Result Value Ref Range Status   Specimen Description BLOOD LEFT HAND   Final   Special Requests BOTTLES DRAWN AEROBIC ONLY 4CC   Final   Culture  Setup Time     Final   Value: 08/09/2014 01:06     Performed at [facility]   Culture     Final   Value: NO GROWTH 5 DAYS     Performed at [facility]   Report Status 08/15/2014 FINAL   Final  MRSA PCR SCREENING      Status: Abnormal   Collection Time    08/09/14  3:20 AM      Result Value Ref Range Status   MRSA by PCR POSITIVE (*) NEGATIVE Final   Comment:            The GeneXpert MRSA Assay (FDA     approved for NASAL specimens     only), is one component of a     comprehensive MRSA colonization     surveillance program. It is not     intended to diagnose MRSA     infection nor to guide or     monitor treatment for     MRSA infections.     RESULT CALLED TO, READ BACK BY AND VERIFIED WITH:     PARRISH,J RN [phone] AT [phone] [facility]    Radiology Reports Ct Head Wo Contrast  08/08/2014   CLINICAL DATA:  Larey Seat.  Hit head.  EXAM: CT HEAD WITHOUT CONTRAST  CT CERVICAL SPINE WITHOUT CONTRAST  TECHNIQUE: Multidetector CT imaging of the head and cervical spine was performed following the standard protocol without intravenous contrast. Multiplanar CT image reconstructions of the cervical spine were also generated.  COMPARISON:  Head CT 11/14/2012  FINDINGS: CT HEAD FINDINGS  Stable age related cerebral atrophy, ventriculomegaly and periventricular white matter disease. No extra-axial fluid collections are identified. No CT findings for acute hemispheric infarction or intracranial hemorrhage. No mass lesions. The brainstem and  cerebellum are normal.  No acute fracture is identified. Extensive ethmoid sinus disease. Moderate fluid in the posterior nasopharynx likely due to a endotracheal tube or NG tube. The mastoid air cells and middle ear cavities are clear.  CT CERVICAL SPINE FINDINGS  The cervical vertebral bodies are normally aligned. No acute fracture. An endotracheal tube is noted. Abnormal prevertebral/ retropharyngeal soft tissue swelling likely due to the endotracheal tube. No acute cervical spine fracture is identified. The C4-5 facet on the left is markedly widened. This could be due to facet arthropathy. Could not exclude the possibility of a septic joint. MRI may be helpful for further evaluation when  able.  Extensive carotid artery calcifications are noted. The lung apices are grossly clear.  IMPRESSION: 1. No acute intracranial findings. Age related cerebral atrophy, ventriculomegaly and periventricular white matter disease are noted. 2. No acute skull fracture. 3. Normal alignment of the cervical vertebral bodies and no acute fracture. 4. Widened C4-5 facet joint on the left could be due to facet arthropathy. Could not exclude a septic joint. MRI may be helpful for further evaluation (without and with contrast) when able.   Electronically Signed   By: Loralie Champagne M.D.   On: 08/08/2014 22:31   Ct Cervical Spine Wo Contrast  08/08/2014   CLINICAL DATA:  Larey Seat.  Hit head.  EXAM: CT HEAD WITHOUT CONTRAST  CT CERVICAL SPINE WITHOUT CONTRAST  TECHNIQUE: Multidetector CT imaging of the head and cervical spine was performed following the standard protocol without intravenous contrast. Multiplanar CT image reconstructions of the cervical spine were also generated.  COMPARISON:  Head CT 11/14/2012  FINDINGS: CT HEAD FINDINGS  Stable age related cerebral atrophy, ventriculomegaly and periventricular white matter disease. No extra-axial fluid collections are identified. No CT findings for acute hemispheric infarction or intracranial hemorrhage. No mass lesions. The brainstem and cerebellum are normal.  No acute fracture is identified. Extensive ethmoid sinus disease. Moderate fluid in the posterior nasopharynx likely due to a endotracheal tube or NG tube. The mastoid air cells and middle ear cavities are clear.  CT CERVICAL SPINE FINDINGS  The cervical vertebral bodies are normally aligned. No acute fracture. An endotracheal tube is noted. Abnormal prevertebral/ retropharyngeal soft tissue swelling likely due to the endotracheal tube. No acute cervical spine fracture is identified. The C4-5 facet on the left is markedly widened. This could be due to facet arthropathy. Could not exclude the possibility of a septic  joint. MRI may be helpful for further evaluation when able.  Extensive carotid artery calcifications are noted. The lung apices are grossly clear.  IMPRESSION: 1. No acute intracranial findings. Age related cerebral atrophy, ventriculomegaly and periventricular white matter disease are noted. 2. No acute skull fracture. 3. Normal alignment of the cervical vertebral bodies and no acute fracture. 4. Widened C4-5 facet joint on the left could be due to facet arthropathy. Could not exclude a septic joint. MRI may be helpful for further evaluation (without and with contrast) when able.   Electronically Signed   By: Loralie Champagne M.D.   On: 08/08/2014 22:31   Dg Pelvis Portable  08/08/2014   CLINICAL DATA:  Central line placement.  EXAM: PORTABLE PELVIS 1-2 VIEWS  COMPARISON:  None.  FINDINGS: There is a right femoral central line in good position without complicating features. The hips are normally located. No obvious pelvic fractures.  IMPRESSION: No acute bony findings.  Right femoral catheter in place.   Electronically Signed   By: Luan Pulling.D.  On: 08/08/2014 21:45   Dg Chest Port 1 View  08/15/2014   CLINICAL DATA:  Respiratory failure, shortness of breath.  EXAM: PORTABLE CHEST - 1 VIEW  COMPARISON:  August 13, 2014.  FINDINGS: Stable cardiomegaly. Endotracheal and nasogastric tubes have been removed. Worsening bilateral basilar opacities are noted with right greater than left. These findings are consistent with worsening edema or pneumonia with associated pleural effusion. No pneumothorax is noted. Bony thorax appears intact.  IMPRESSION: Increased bilateral basilar opacities are noted consistent with worsening pneumonia or edema with associated pleural effusions.   Electronically Signed   By: Roque Lias M.D.   On: 08/15/2014 07:52   Dg Chest Portable 1 View  08/13/2014   CLINICAL DATA:  Clinical pulmonary edema  EXAM: PORTABLE CHEST - 1 VIEW  COMPARISON:  Portable chest x-ray of August 10, 2014  FINDINGS: There has been considerable improvement in the appearance of the pulmonary interstitium bilaterally. There remain mildly increased densities at the lung bases. The cardiopericardial silhouette is enlarged. The pulmonary vascularity is not engorged. The endotracheal tube tip lies 6.5 cm above the crotch of the carina. The esophagogastric tube tip projects off the inferior margin of the study. The right clavicle is absent.  IMPRESSION: There has been improvement in the pulmonary edema since the previous study. Mild subsegmental atelectasis at the lung bases persists.   Electronically Signed   By: David  [place]   On: 08/13/2014 07:46   Dg Chest Port 1 View  08/10/2014   CLINICAL DATA:  Intubated, evaluate endotracheal tube  EXAM: PORTABLE CHEST - 1 VIEW  COMPARISON:  Prior chest x-ray 08/09/2014  FINDINGS: Patient is rotated toward the right. Given rotated position, the degree of cardiomegaly is similar compared to prior. However, there has been an interval increase in pulmonary vascular congestion now with mild interstitial edema. Slightly enlarged bilateral layering pleural effusions. Persistent bibasilar opacities favored to reflect atelectasis. No pneumothorax. The tip of the orogastric tube lies below the diaphragm presumably within the stomach. The endotracheal tube is 4.7 cm above the carina.  IMPRESSION: 1. Worsening pulmonary vascular congestion now with interstitial pulmonary edema. 2. Slightly enlarged bilateral layering pleural effusions and associated bibasilar atelectasis. 3. Stable and satisfactory support apparatus.   Electronically Signed   By: Malachy Moan M.D.   On: 08/10/2014 07:45   Dg Chest Port 1 View  08/09/2014   CLINICAL DATA:  Intubation.  EXAM: PORTABLE CHEST - 1 VIEW  COMPARISON:  06/08/2014.  FINDINGS: Endotracheal tube and NG tube in good anatomic position. Cardiomegaly, stable. Pulmonary vascularity is normal. Low lung volumes with bibasilar atelectasis and/or  mild infiltrate. Small all pleural effusions cannot be excluded. No pneumothorax. No acute osseous abnormality. Old posttraumatic deformity right clavicle.  IMPRESSION: 1. Line tubes in good anatomic position. Interim placement of NG tube. Its tip is below left hemidiaphragm. 2. Stable cardiomegaly with normal pulmonary vascularity. 3. Mild bibasilar atelectasis and/or infiltrates. Small pleural effusions cannot be excluded . 4. Old posttraumatic deformity right clavicle.   Electronically Signed   By: Maisie Fus  Register   On: 08/09/2014 07:45   Dg Chest Port 1 View  08/08/2014   CLINICAL DATA:  Bradycardia.  Hypoglycemia.  EXAM: PORTABLE CHEST - 1 VIEW  COMPARISON:  08/02/2011  FINDINGS: Endotracheal tube is in good position 3 cm above the carina.  There is chronic cardiomegaly. Pulmonary vascularity is normal and the lungs are clear. No acute osseous abnormality. 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  IMPRESSION: Endotracheal tube in good position.  Chronic cardiomegaly.   Electronically Signed   By: Geanie Cooley M.D.   On: 08/08/2014 21:32    CBC  Recent Labs Lab 08/12/14 0308 08/13/14 0450 08/14/14 0445 08/15/14 0524 08/17/14 0235 08/18/14 0520  WBC 8.4 8.3 5.5 6.3 7.5 8.6  HGB 6.8* 7.4* 6.8* 8.7* 8.3* 8.2*  HCT 20.6* 22.5* 20.3* 25.2* 24.6* 24.8*  PLT 147* 171 147* 144* 211 199  MCV 83.4 83.6 84.6 81.0 86.3 86.7  MCH 27.5 27.5 28.3 28.0 29.1 28.7  MCHC 33.0 32.9 33.5 34.5 33.7 33.1  RDW 18.5* 18.4* 18.2* 17.4* 17.5* 17.6*  LYMPHSABS 1.3  --   --   --   --   --   MONOABS 0.7  --   --   --   --   --   EOSABS 0.1  --   --   --   --   --   BASOSABS 0.0  --   --   --   --   --     Chemistries   Recent Labs Lab 08/13/14 0450 08/14/14 0445 08/15/14 0524 08/17/14 0235 08/18/14 0520  NA 142 146 145 150* 148*  K 3.7 3.8 3.9 3.9 3.6*  CL 110 112 110 114* 115*  CO2 17* 18* 17* 18* 18*  GLUCOSE 173* 134* 119* 78 117*  BUN 114* 116* 120* 113* 103*  CREATININE 5.70* 5.75*  5.79* 5.83* 5.74*  CALCIUM 7.8* 7.5* 8.3* 7.8* 8.0*  MG  --   --   --   --  1.7  AST  --   --   --  32 24  ALT  --   --   --  53* 42*  ALKPHOS  --   --   --  182* 169*  BILITOT  --   --   --  0.3 0.2*   ------------------------------------------------------------------------------------------------------------------ estimated creatinine clearance is 8.6 ml/min (by C-G formula based on Cr of 5.74). ------------------------------------------------------------------------------------------------------------------ No results found for this basename: HGBA1C,  in the last 72 hours ------------------------------------------------------------------------------------------------------------------ No results found for this basename: CHOL, HDL, LDLCALC, TRIG, CHOLHDL, LDLDIRECT,  in the last 72 hours ------------------------------------------------------------------------------------------------------------------ No results found for this basename: TSH, T4TOTAL, FREET3, T3FREE, THYROIDAB,  in the last 72 hours ------------------------------------------------------------------------------------------------------------------ No results found for this basename: VITAMINB12, FOLATE, FERRITIN, TIBC, IRON, RETICCTPCT,  in the last 72 hours  Coagulation profile No results found for this basename: INR, PROTIME,  in the last 168 hours  No results found for this basename: DDIMER,  in the last 72 hours  Cardiac Enzymes No results found for this basename: CK, CKMB, TROPONINI, MYOGLOBIN,  in the last 168 hours ------------------------------------------------------------------------------------------------------------------ No components found with this basename: POCBNP,     Mardy Hoppe D.O. on 08/18/2014 at 1:41 PM  Between 7am to 7pm - Pager - [phone]  After 7pm go to [email or web address] - password [email or web address]  And look for the night coverage person covering for me after hours  [facility]  Group Office  [phone]

## 2014-08-18 NOTE — Progress Notes (Signed)
Patient [name]      DOB: 06-12-1946      VWU:[id number]  Checked in on patient yesterday evening.  She was comfortably confused no distress on vent mask. Nursing reported she will eat a few bits, but generally not progressing.  I have left a message for her daughter [name]. Patient would likely be a good candidate for hospice facility. Prognosis if not eating reliably is likely days to week.   Will try to communicate with [name] again today.  [name] L. Ladona Ridgel, MD MBA The Palliative Medicine Team at [facility] Phone: [phone] Pager: [phone] ( Use team phone after hours)

## 2014-08-18 NOTE — Progress Notes (Signed)
SLP Cancellation Note  Patient Details Name: [name] MRN: [id number] DOB: 02/10/1946   Cancelled treatment:       Reason Eval/Treat Not Completed: Patient declined, no reason specified. Patient had recently finished breakfast per sitter, and was not agreeable to PO trials with SLP at this time despite encouragement. SLP unable to observe patient with intake at this time. Will continue to make attempts.    Maxcine Ham, M.A. CCC-SLP [phone]  Maxcine Ham 08/18/2014, 11:51 AM

## 2014-08-19 LAB — CBC
HCT: 24.1 % — ABNORMAL LOW (ref 36.0–46.0)
Hemoglobin: 8.1 g/dL — ABNORMAL LOW (ref 12.0–15.0)
MCH: 29 pg (ref 26.0–34.0)
MCHC: 33.6 g/dL (ref 30.0–36.0)
MCV: 86.4 fL (ref 78.0–100.0)
PLATELETS: 211 10*3/uL (ref 150–400)
RBC: 2.79 MIL/uL — ABNORMAL LOW (ref 3.87–5.11)
RDW: 17.4 % — AB (ref 11.5–15.5)
WBC: 9.3 10*3/uL (ref 4.0–10.5)

## 2014-08-19 LAB — COMPREHENSIVE METABOLIC PANEL
ALBUMIN: 1.8 g/dL — AB (ref 3.5–5.2)
ALT: 35 U/L (ref 0–35)
AST: 20 U/L (ref 0–37)
Alkaline Phosphatase: 157 U/L — ABNORMAL HIGH (ref 39–117)
Anion gap: 14 (ref 5–15)
BUN: 93 mg/dL — ABNORMAL HIGH (ref 6–23)
CALCIUM: 7.9 mg/dL — AB (ref 8.4–10.5)
CHLORIDE: 114 meq/L — AB (ref 96–112)
CO2: 18 meq/L — AB (ref 19–32)
CREATININE: 5.6 mg/dL — AB (ref 0.50–1.10)
GFR calc Af Amer: 8 mL/min — ABNORMAL LOW (ref >90)
GFR calc non Af Amer: 7 mL/min — ABNORMAL LOW (ref >90)
Glucose, Bld: 115 mg/dL — ABNORMAL HIGH (ref 70–99)
Potassium: 3.6 mEq/L — ABNORMAL LOW (ref 3.7–5.3)
SODIUM: 146 meq/L (ref 137–147)
Total Bilirubin: <0.2 mg/dL — ABNORMAL LOW (ref 0.3–1.2)
Total Protein: 5.6 g/dL — ABNORMAL LOW (ref 6.0–8.3)

## 2014-08-19 LAB — GLUCOSE, CAPILLARY
Glucose-Capillary: 116 mg/dL — ABNORMAL HIGH (ref 70–99)
Glucose-Capillary: 127 mg/dL — ABNORMAL HIGH (ref 70–99)

## 2014-08-19 LAB — MAGNESIUM: MAGNESIUM: 1.8 mg/dL (ref 1.5–2.5)

## 2014-08-19 MED ORDER — SODIUM CHLORIDE 0.9 % IJ SOLN
10.0000 mL | INTRAMUSCULAR | Status: DC | PRN
  Administered 2014-08-19: 10 mL

## 2014-08-19 MED ORDER — ENSURE PUDDING PO PUDG: 1.0000 | Freq: Three times a day (TID) | ORAL | Status: AC

## 2014-08-19 MED ORDER — LEVOTHYROXINE SODIUM 50 MCG PO TABS: 50.0000 ug | ORAL_TABLET | Freq: Every day | ORAL | Status: AC

## 2014-08-19 NOTE — Discharge Summary (Signed)
Physician Discharge Summary  Shirleen Mcfaul Ozaki ZOX:[id number] DOB: 08-06-1946 DOA: 08/08/2014  PCP: [name], MD  Admit date: 08/08/2014 Discharge date: 08/19/2014  Time spent: 45 minutes  Recommendations for Outpatient Follow-up:  Patient will be discharged to home with hospice. Patient should follow up with her primary care physician as needed and should have a repeat CBC. Patient should continue her medications as prescribed. Patient should follow a dysphagia 1 diet. Patient should also have her thyroid function reevaluated within 6-8 weeks.  Discharge Diagnoses:  Sepsis secondary to Lactobacillus UTI Acute respiratory failure secondary to encephalopathy and inability to protect airway Hypovolemia/septic shock Hypernatremia Bradycardia Hypertension Acute Kidney injury on CKD Stage 5 Elevated transaminases The chronic disease and critical illness History of diabetes mellitus type 2 Hypothyroidism Advanced dementia  Discharge Condition: Stable  Diet recommendation: Dysphagia 1  Filed Weights   08/14/14 0400 08/15/14 0500 08/16/14 0600  Weight: 76 kg (167 lb 8.8 oz) 75.6 kg (166 lb 10.7 oz) 73.2 kg (161 lb 6 oz)    History of present illness:  on 08/08/2014  68 year old female with PMH of hypertension, diabetes, dementia. Upon admission, the family stated that patient was at her baseline and has advanced dementia. She requires assistance for all ADLs including walking, and is frequently confused. Family reported 8/15 she had some slurred and slowing of speech which resolved spontaneously. She was in her usual state of health after that. Then 8/18 she was walked to the toilet and left there for short period when family heard a thud. They entered bathroom to find her on floor with laceration above L eye. She was unresponsive. EMS was called and at their arrival she was found to be hypotensive and bradycardic with pulse in the 40s. She was given lidocaine, placed on  transcutaneous pacer, and brought to the ED where she was intubated for airway protection. PCCM asked to see for admission.   Hospital Course:  Sepsis with ? Lactobacillus UTI  -Appears to be stable  -unclear if this is simply contaminant or true infection  -Completed antibiotic course   Acute respiratory failure 2nd to encephalopathy and inability to protect airway  -Appears to be stable  -Currently DNR  -Required intubation upon admission for airway protection  -Continue oxygen to maintain sats >92%   Hypovolemic / septic shock  -resolved   Hypernatremia  -Resolved, was placed on IVF  Bradycardia  -resolved, however with syncopal episode, echo was obtained  -Possible mediated by respiratory failure with hypoxia, complicated by beta blockers  -Echocardiogram: EF of 50-55%, grade 1 diastolic dysfunction   Hypertension  -continue norvasc   AKI, non oliguric on CKD stage 5 -baseline Cr approx 5, appears to be stable, 5.60   Elevated transaminases  -Likely due to hypoperfusion (shock liver)  -Improving, LFTs trending downward   Anemia of chronic disease and critical illness  -No obvious s/s bleeding  -Patient received blood transfusions during hospital course  -Hemoglobin currently stable, 8.1  History of diabetes mellitus type II with episodes of hypoglycemia  -CBG well controlled at this time   Hypothyroidism  -TSH 9.75 w/ low free T3 and free T4  -synthroid has been increased, recheck in 6-8 weeks   Advanced dementia  -Palliative care consult is appreciated  -Speech evaluation; recommends Dysphagia 1 (Puree);Thin liquid  -Dr. Ladona Ridgel spoke with patient's daughter, [name], who is open to hospice care  -Case management made aware   Procedures/Significant Events  8/18 Fall from toilet, brady on pacer pads, intubated  in ED. Admitted to ICU  8/19 off pressors  8/20 brady improved, DNR established  8/21 one unit PRBCs transfused for Hgb 6.8  8/24 extubated  8/24  one unit PRBCs transfused for Hgb 6.8  8/26 single lumen midline PICC placed   Consults  PCCM  Palliative care  Discharge Exam: Filed Vitals:   08/19/14 0942  BP: 100/55  Pulse: 63  Temp:   Resp:    Exam  General: Well developed, well nourished, NAD, appears stated age  HEENT: [place], laceration above the left eye, mucous membranes moist.  Cardiovascular: S1 S2 auscultated, no rubs, murmurs or gallops. Regular rate and rhythm.  Respiratory: Clear to auscultation bilaterally with equal chest rise  Abdomen: Soft, nondistended  Extremities: warm dry without cyanosis clubbing or edema   Discharge Instructions      Discharge Instructions   Discharge instructions    Complete by:  As directed   Patient will be discharged to home with hospice. Patient should follow up with her primary care physician as needed and should have a repeat CBC. Patient should continue her medications as prescribed. Patient should follow a dysphagia 1 diet. Patient should also have her thyroid function reevaluated within 6-8 weeks.            Medication List    STOP taking these medications       furosemide 80 MG tablet  Commonly known as:  LASIX      TAKE these medications       ALPRAZolam 0.25 MG tablet  Commonly known as:  XANAX  Take 0.25 mg by mouth at bedtime as needed. For sleep     amitriptyline 50 MG tablet  Commonly known as:  ELAVIL  Take 50 mg by mouth at bedtime.     amLODipine 10 MG tablet  Commonly known as:  NORVASC  Take 10 mg by mouth daily.     CENTRUM PO  Take 1 tablet by mouth daily.     feeding supplement (ENSURE) Pudg  Take 1 Container by mouth 3 (three) times daily between meals.     insulin NPH Human 100 UNIT/ML injection  Commonly known as:  HUMULIN N,NOVOLIN N  Inject 10-20 Units into the skin 2 (two) times daily before a meal. 20 units in am and 10 units in pm     levothyroxine 50 MCG tablet  Commonly known as:  SYNTHROID  Take 1 tablet (50 mcg total) by  mouth daily before breakfast.     metoprolol tartrate 25 MG tablet  Commonly known as:  LOPRESSOR  Take 25 mg by mouth 2 (two) times daily.       Allergies  Allergen Reactions  . Penicillins Anaphylaxis  . Vancomycin Rash   Follow-up Information   Follow up with [facility], MD. Schedule an appointment as soon as possible for a visit in 1 week. [facility] follow up)    Specialty:  Cardiology   Contact information:   [address] [place] [address] [phone]        The results of significant diagnostics from this hospitalization (including imaging, microbiology, ancillary and laboratory) are listed below for reference.    Significant Diagnostic Studies: Ct Head Wo Contrast  08/08/2014   CLINICAL DATA:  Larey Seat.  Hit head.  EXAM: CT HEAD WITHOUT CONTRAST  CT CERVICAL SPINE WITHOUT CONTRAST  TECHNIQUE: Multidetector CT imaging of the head and cervical spine was performed following the standard protocol without intravenous contrast. Multiplanar CT image reconstructions of  the cervical spine were also generated.  COMPARISON:  Head CT 11/14/2012  FINDINGS: CT HEAD FINDINGS  Stable age related cerebral atrophy, ventriculomegaly and periventricular white matter disease. No extra-axial fluid collections are identified. No CT findings for acute hemispheric infarction or intracranial hemorrhage. No mass lesions. The brainstem and cerebellum are normal.  No acute fracture is identified. Extensive ethmoid sinus disease. Moderate fluid in the posterior nasopharynx likely due to a endotracheal tube or NG tube. The mastoid air cells and middle ear cavities are clear.  CT CERVICAL SPINE FINDINGS  The cervical vertebral bodies are normally aligned. No acute fracture. An endotracheal tube is noted. Abnormal prevertebral/ retropharyngeal soft tissue swelling likely due to the endotracheal tube. No acute cervical spine fracture is identified. The C4-5 facet on the left is markedly widened. This  could be due to facet arthropathy. Could not exclude the possibility of a septic joint. MRI may be helpful for further evaluation when able.  Extensive carotid artery calcifications are noted. The lung apices are grossly clear.  IMPRESSION: 1. No acute intracranial findings. Age related cerebral atrophy, ventriculomegaly and periventricular white matter disease are noted. 2. No acute skull fracture. 3. Normal alignment of the cervical vertebral bodies and no acute fracture. 4. Widened C4-5 facet joint on the left could be due to facet arthropathy. Could not exclude a septic joint. MRI may be helpful for further evaluation (without and with contrast) when able.   Electronically Signed   By: Loralie Champagne M.D.   On: 08/08/2014 22:31   Ct Cervical Spine Wo Contrast  08/08/2014   CLINICAL DATA:  Larey Seat.  Hit head.  EXAM: CT HEAD WITHOUT CONTRAST  CT CERVICAL SPINE WITHOUT CONTRAST  TECHNIQUE: Multidetector CT imaging of the head and cervical spine was performed following the standard protocol without intravenous contrast. Multiplanar CT image reconstructions of the cervical spine were also generated.  COMPARISON:  Head CT 11/14/2012  FINDINGS: CT HEAD FINDINGS  Stable age related cerebral atrophy, ventriculomegaly and periventricular white matter disease. No extra-axial fluid collections are identified. No CT findings for acute hemispheric infarction or intracranial hemorrhage. No mass lesions. The brainstem and cerebellum are normal.  No acute fracture is identified. Extensive ethmoid sinus disease. Moderate fluid in the posterior nasopharynx likely due to a endotracheal tube or NG tube. The mastoid air cells and middle ear cavities are clear.  CT CERVICAL SPINE FINDINGS  The cervical vertebral bodies are normally aligned. No acute fracture. An endotracheal tube is noted. Abnormal prevertebral/ retropharyngeal soft tissue swelling likely due to the endotracheal tube. No acute cervical spine fracture is identified.  The C4-5 facet on the left is markedly widened. This could be due to facet arthropathy. Could not exclude the possibility of a septic joint. MRI may be helpful for further evaluation when able.  Extensive carotid artery calcifications are noted. The lung apices are grossly clear.  IMPRESSION: 1. No acute intracranial findings. Age related cerebral atrophy, ventriculomegaly and periventricular white matter disease are noted. 2. No acute skull fracture. 3. Normal alignment of the cervical vertebral bodies and no acute fracture. 4. Widened C4-5 facet joint on the left could be due to facet arthropathy. Could not exclude a septic joint. MRI may be helpful for further evaluation (without and with contrast) when able.   Electronically Signed   By: Loralie Champagne M.D.   On: 08/08/2014 22:31   Dg Pelvis Portable  08/08/2014   CLINICAL DATA:  Central line placement.  EXAM: PORTABLE PELVIS 1-2 VIEWS  COMPARISON:  None.  FINDINGS: There is a right femoral central line in good position without complicating features. The hips are normally located. No obvious pelvic fractures.  IMPRESSION: No acute bony findings.  Right femoral catheter in place.   Electronically Signed   By: Loralie Champagne M.D.   On: 08/08/2014 21:45   Dg Chest Port 1 View  08/15/2014   CLINICAL DATA:  Respiratory failure, shortness of breath.  EXAM: PORTABLE CHEST - 1 VIEW  COMPARISON:  August 13, 2014.  FINDINGS: Stable cardiomegaly. Endotracheal and nasogastric tubes have been removed. Worsening bilateral basilar opacities are noted with right greater than left. These findings are consistent with worsening edema or pneumonia with associated pleural effusion. No pneumothorax is noted. Bony thorax appears intact.  IMPRESSION: Increased bilateral basilar opacities are noted consistent with worsening pneumonia or edema with associated pleural effusions.   Electronically Signed   By: Roque Lias M.D.   On: 08/15/2014 07:52   Dg Chest Portable 1  View  08/13/2014   CLINICAL DATA:  Clinical pulmonary edema  EXAM: PORTABLE CHEST - 1 VIEW  COMPARISON:  Portable chest x-ray of August 10, 2014  FINDINGS: There has been considerable improvement in the appearance of the pulmonary interstitium bilaterally. There remain mildly increased densities at the lung bases. The cardiopericardial silhouette is enlarged. The pulmonary vascularity is not engorged. The endotracheal tube tip lies 6.5 cm above the crotch of the carina. The esophagogastric tube tip projects off the inferior margin of the study. The right clavicle is absent.  IMPRESSION: There has been improvement in the pulmonary edema since the previous study. Mild subsegmental atelectasis at the lung bases persists.   Electronically Signed   By: David  [place]   On: 08/13/2014 07:46   Dg Chest Port 1 View  08/10/2014   CLINICAL DATA:  Intubated, evaluate endotracheal tube  EXAM: PORTABLE CHEST - 1 VIEW  COMPARISON:  Prior chest x-ray 08/09/2014  FINDINGS: Patient is rotated toward the right. Given rotated position, the degree of cardiomegaly is similar compared to prior. However, there has been an interval increase in pulmonary vascular congestion now with mild interstitial edema. Slightly enlarged bilateral layering pleural effusions. Persistent bibasilar opacities favored to reflect atelectasis. No pneumothorax. The tip of the orogastric tube lies below the diaphragm presumably within the stomach. The endotracheal tube is 4.7 cm above the carina.  IMPRESSION: 1. Worsening pulmonary vascular congestion now with interstitial pulmonary edema. 2. Slightly enlarged bilateral layering pleural effusions and associated bibasilar atelectasis. 3. Stable and satisfactory support apparatus.   Electronically Signed   By: Malachy Moan M.D.   On: 08/10/2014 07:45   Dg Chest Port 1 View  08/09/2014   CLINICAL DATA:  Intubation.  EXAM: PORTABLE CHEST - 1 VIEW  COMPARISON:  06/08/2014.  FINDINGS: Endotracheal tube and  NG tube in good anatomic position. Cardiomegaly, stable. Pulmonary vascularity is normal. Low lung volumes with bibasilar atelectasis and/or mild infiltrate. Small all pleural effusions cannot be excluded. No pneumothorax. No acute osseous abnormality. Old posttraumatic deformity right clavicle.  IMPRESSION: 1. Line tubes in good anatomic position. Interim placement of NG tube. Its tip is below left hemidiaphragm. 2. Stable cardiomegaly with normal pulmonary vascularity. 3. Mild bibasilar atelectasis and/or infiltrates. Small pleural effusions cannot be excluded . 4. Old posttraumatic deformity right clavicle.   Electronically Signed   By: Maisie Fus  Register   On: 08/09/2014 07:45   Dg Chest Port 1 View  08/08/2014   CLINICAL DATA:  Bradycardia.  Hypoglycemia.  EXAM: PORTABLE CHEST - 1 VIEW  COMPARISON:  08/02/2011  FINDINGS: Endotracheal tube is in good position 3 cm above the carina.  There is chronic cardiomegaly. Pulmonary vascularity is normal and the lungs are clear. No acute osseous abnormality. 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  IMPRESSION: Endotracheal tube in good position.  Chronic cardiomegaly.   Electronically Signed   By: Geanie Cooley M.D.   On: 08/08/2014 21:32    Microbiology: No results found for this or any previous visit (from the past 240 hour(s)).   Labs: Basic Metabolic Panel:  Recent Labs Lab 08/14/14 0445 08/15/14 0524 08/17/14 0235 08/18/14 0520 08/19/14 0535  NA 146 145 150* 148* 146  K 3.8 3.9 3.9 3.6* 3.6*  CL 112 110 114* 115* 114*  CO2 18* 17* 18* 18* 18*  GLUCOSE 134* 119* 78 117* 115*  BUN 116* 120* 113* 103* 93*  CREATININE 5.75* 5.79* 5.83* 5.74* 5.60*  CALCIUM 7.5* 8.3* 7.8* 8.0* 7.9*  MG  --   --   --  1.7 1.8   Liver Function Tests:  Recent Labs Lab 08/17/14 0235 08/18/14 0520 08/19/14 0535  AST 32 24 20  ALT 53* 42* 35  ALKPHOS 182* 169* 157*  BILITOT 0.3 0.2* <0.2*  PROT 5.3* 5.6* 5.6*  ALBUMIN 1.9* 1.8* 1.8*   No results  found for this basename: LIPASE, AMYLASE,  in the last 168 hours No results found for this basename: AMMONIA,  in the last 168 hours CBC:  Recent Labs Lab 08/14/14 0445 08/15/14 0524 08/17/14 0235 08/18/14 0520 08/19/14 0535  WBC 5.5 6.3 7.5 8.6 9.3  HGB 6.8* 8.7* 8.3* 8.2* 8.1*  HCT 20.3* 25.2* 24.6* 24.8* 24.1*  MCV 84.6 81.0 86.3 86.7 86.4  PLT 147* 144* 211 199 211   Cardiac Enzymes: No results found for this basename: CKTOTAL, CKMB, CKMBINDEX, TROPONINI,  in the last 168 hours BNP: BNP (last 3 results) No results found for this basename: PROBNP,  in the last 8760 hours CBG:  Recent Labs Lab 08/18/14 0831 08/18/14 1201 08/18/14 1724 08/18/14 2152 08/19/14 0747  GLUCAP 104* 137* 144* 106* 116*       Signed:  Jaylene Schrom  [facility] 08/19/2014, 10:41 AM

## 2014-08-19 NOTE — Progress Notes (Signed)
Patient is medically stable for D/C home with hospice today. Clinical Child psychotherapist (CSW) arranged EMS for transport home. CSW confirmed address with patient's with daughter [name]. Please reconsult if future social work needs arise. CSW signing off.   Jetta Lout, LCSWA Weekend CSW [phone]

## 2017-03-22 DEATH — deceased
